# Patient Record
Sex: Female | Born: 1995 | Race: White | Hispanic: No | Smoking: Never smoker
Health system: Southern US, Community
[De-identification: ages and names within clinical notes are randomized; demographics above are authoritative.]

## PROBLEM LIST (undated history)

## (undated) DIAGNOSIS — F419 Anxiety disorder, unspecified: Secondary | ICD-10-CM

## (undated) DIAGNOSIS — Q774 Achondroplasia: Secondary | ICD-10-CM

## (undated) DIAGNOSIS — E079 Disorder of thyroid, unspecified: Secondary | ICD-10-CM

## (undated) DIAGNOSIS — Q789 Osteochondrodysplasia, unspecified: Secondary | ICD-10-CM

## (undated) DIAGNOSIS — F909 Attention-deficit hyperactivity disorder, unspecified type: Secondary | ICD-10-CM

## (undated) DIAGNOSIS — F4310 Post-traumatic stress disorder, unspecified: Secondary | ICD-10-CM

## (undated) DIAGNOSIS — Q039 Congenital hydrocephalus, unspecified: Secondary | ICD-10-CM

## (undated) DIAGNOSIS — F32A Depression, unspecified: Secondary | ICD-10-CM

## (undated) DIAGNOSIS — F329 Major depressive disorder, single episode, unspecified: Secondary | ICD-10-CM

## (undated) HISTORY — DX: Achondroplasia: Q77.4

## (undated) HISTORY — DX: Attention-deficit hyperactivity disorder, unspecified type: F90.9

## (undated) HISTORY — DX: Congenital hydrocephalus, unspecified: Q03.9

## (undated) HISTORY — DX: Osteochondrodysplasia, unspecified: Q78.9

---
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
## 2019-03-08 ENCOUNTER — Other Ambulatory Visit: Payer: Self-pay

## 2019-03-08 DIAGNOSIS — Z20822 Contact with and (suspected) exposure to covid-19: Secondary | ICD-10-CM

## 2019-03-09 LAB — NOVEL CORONAVIRUS, NAA: SARS-CoV-2, NAA: NOT DETECTED

## 2019-03-15 ENCOUNTER — Telehealth: Payer: Self-pay

## 2019-03-15 NOTE — Telephone Encounter (Signed)
Pt. Called back, given COVID 19 results. 

## 2019-04-23 ENCOUNTER — Emergency Department (HOSPITAL_COMMUNITY)
Admission: EM | Admit: 2019-04-23 | Discharge: 2019-04-23 | Disposition: A | Discharge status: Home / Self Care | Payer: Medicaid Other | Attending: Emergency Medicine | Admitting: Emergency Medicine

## 2019-04-23 ENCOUNTER — Encounter (HOSPITAL_COMMUNITY): Payer: Self-pay | Admitting: Emergency Medicine

## 2019-04-23 ENCOUNTER — Other Ambulatory Visit: Payer: Self-pay

## 2019-04-23 DIAGNOSIS — N3001 Acute cystitis with hematuria: Secondary | ICD-10-CM | POA: Diagnosis not present

## 2019-04-23 DIAGNOSIS — F17290 Nicotine dependence, other tobacco product, uncomplicated: Secondary | ICD-10-CM | POA: Diagnosis not present

## 2019-04-23 DIAGNOSIS — R300 Dysuria: Secondary | ICD-10-CM | POA: Diagnosis present

## 2019-04-23 DIAGNOSIS — L249 Irritant contact dermatitis, unspecified cause: Secondary | ICD-10-CM | POA: Diagnosis not present

## 2019-04-23 HISTORY — DX: Disorder of thyroid, unspecified: E07.9

## 2019-04-23 HISTORY — DX: Post-traumatic stress disorder, unspecified: F43.10

## 2019-04-23 HISTORY — DX: Depression, unspecified: F32.A

## 2019-04-23 HISTORY — DX: Anxiety disorder, unspecified: F41.9

## 2019-04-23 LAB — URINALYSIS, ROUTINE W REFLEX MICROSCOPIC
Glucose, UA: 250 mg/dL — AB
Ketones, ur: >80 mg/dL — AB
Nitrite: POSITIVE — AB
Protein, ur: >300 mg/dL — AB
Specific Gravity, Urine: 1.02 (ref 1.005–1.030)
pH: 6.5 (ref 5.0–8.0)

## 2019-04-23 LAB — COMPREHENSIVE METABOLIC PANEL
ALT: 21 U/L (ref 0–44)
AST: 29 U/L (ref 15–41)
Albumin: 3.9 g/dL (ref 3.5–5.0)
Alkaline Phosphatase: 86 U/L (ref 38–126)
Anion gap: 8 (ref 5–15)
BUN: 11 mg/dL (ref 6–20)
CO2: 23 mmol/L (ref 22–32)
Calcium: 8.9 mg/dL (ref 8.9–10.3)
Chloride: 107 mmol/L (ref 98–111)
Creatinine, Ser: 0.43 mg/dL — ABNORMAL LOW (ref 0.44–1.00)
GFR calc Af Amer: >60 mL/min (ref >60)
GFR calc non Af Amer: >60 mL/min (ref >60)
Glucose, Bld: 97 mg/dL (ref 70–99)
Potassium: 3.8 mmol/L (ref 3.5–5.1)
Sodium: 138 mmol/L (ref 135–145)
Total Bilirubin: 0.7 mg/dL (ref 0.3–1.2)
Total Protein: 7.2 g/dL (ref 6.5–8.1)

## 2019-04-23 LAB — CBC WITH DIFFERENTIAL/PLATELET
Abs Immature Granulocytes: 0.08 10*3/uL — ABNORMAL HIGH (ref 0.00–0.07)
Basophils Absolute: 0 10*3/uL (ref 0.0–0.1)
Basophils Relative: 0 %
Eosinophils Absolute: 0.2 10*3/uL (ref 0.0–0.5)
Eosinophils Relative: 1 %
HCT: 43.8 % (ref 36.0–46.0)
Hemoglobin: 14.2 g/dL (ref 12.0–15.0)
Immature Granulocytes: 1 %
Lymphocytes Relative: 13 %
Lymphs Abs: 2.1 10*3/uL (ref 0.7–4.0)
MCH: 25.1 pg — ABNORMAL LOW (ref 26.0–34.0)
MCHC: 32.4 g/dL (ref 30.0–36.0)
MCV: 77.4 fL — ABNORMAL LOW (ref 80.0–100.0)
Monocytes Absolute: 1 10*3/uL (ref 0.1–1.0)
Monocytes Relative: 6 %
Neutro Abs: 12.7 10*3/uL — ABNORMAL HIGH (ref 1.7–7.7)
Neutrophils Relative %: 79 %
Platelets: 319 10*3/uL (ref 150–400)
RBC: 5.66 MIL/uL — ABNORMAL HIGH (ref 3.87–5.11)
RDW: 14.1 % (ref 11.5–15.5)
WBC: 16.2 10*3/uL — ABNORMAL HIGH (ref 4.0–10.5)
nRBC: 0 % (ref 0.0–0.2)

## 2019-04-23 LAB — PREGNANCY, URINE: Preg Test, Ur: NEGATIVE

## 2019-04-23 LAB — URINALYSIS, MICROSCOPIC (REFLEX): Squamous Epithelial / HPF: NONE SEEN (ref 0–5)

## 2019-04-23 LAB — LIPASE, BLOOD: Lipase: 32 U/L (ref 11–51)

## 2019-04-23 LAB — CBG MONITORING, ED: Glucose-Capillary: 82 mg/dL (ref 70–99)

## 2019-04-23 MED ORDER — KETOCONAZOLE 2 % EX CREA: 1.0000 "application " | TOPICAL_CREAM | Freq: Every day | CUTANEOUS | 0 refills | Status: DC

## 2019-04-23 MED ORDER — HYDROCORTISONE BUTYRATE 0.1 % EX CREA: 1.0000 "application " | TOPICAL_CREAM | Freq: Two times a day (BID) | CUTANEOUS | 0 refills | Status: DC

## 2019-04-23 MED ORDER — SODIUM CHLORIDE 0.9 % IV SOLN
1.0000 g | Freq: Once | INTRAVENOUS | Status: AC
  Administered 2019-04-23: 1 g via INTRAVENOUS
  Filled 2019-04-23: qty 10

## 2019-04-23 MED ORDER — CEPHALEXIN 500 MG PO CAPS: 500.0000 mg | ORAL_CAPSULE | Freq: Two times a day (BID) | ORAL | 0 refills | Status: AC

## 2019-04-23 NOTE — ED Triage Notes (Signed)
Patient c/o dysuria with frequency and hematuria that started last night. Denies any fevers, nausea, or vomiting. Patient also c/o rash that appeared yesterday under right eye with itching. Denies any pain.

## 2019-04-23 NOTE — ED Provider Notes (Addendum)
[facility] EMERGENCY DEPARTMENT Provider Note   CSN: [id number] Arrival date & time: 04/23/19  1146     History   Chief Complaint Chief Complaint  Patient presents with  . Dysuria    HPI Katelyn Reed is a 23 y.o. female presents today with 2 concerns.  Patient reports her primary concern is a pruritic rash on the right side of her nose as well as below her left knee.  These occurred yesterday and have been constant since onset a mild itching sensation without aggravating or alleviating factors.  She denies similar in the past.  She reports some scaling of the skin in these areas.  She denies previous diagnosis of tenia.  Patient secondary concern today is dysuria and hematuria that began after she woke up this morning.  She reports a mild burning sensation when she urinates that improves when she stops urinating, no radiation of the symptoms, she reports blood in her urine starting today, she denies similar in the past.  Patient denies fever/chills, headache/vision changes, cough/shortness of breath, chest pain, nausea/vomiting, abdominal pain, flank pain, fall/injury, vaginal bleeding/discharge, concern for STD or any additional concerns today.     HPI  Past Medical History:  Diagnosis Date  . Anxiety   . Depression   . PTSD (post-traumatic stress disorder)   . Thyroid disease     There are no active problems to display for this patient.   History reviewed. No pertinent surgical history.   OB History    Gravida  0   Para  0   Term  0   Preterm  0   AB  0   Living  0     SAB  0   TAB  0   Ectopic  0   Multiple  0   Live Births  0            Home Medications    Prior to Admission medications   Medication Sig Start Date End Date Taking? Authorizing Provider  cephALEXin (KEFLEX) 500 MG capsule Take 1 capsule (500 mg total) by mouth 2 (two) times daily for 7 days. 04/23/19 04/30/19  Harlene SaltsMorelli, Jacey Pelc A, PA-C  hydrocortisone butyrate (LUCOID) 0.1 %  CREA cream Apply 1 application topically 2 (two) times daily. Do not use on the face for more than 1 week as it will cause skin thinning. 04/23/19   Bill SalinasMorelli, Derya Dettmann A, PA-C    Family History Family History  Adopted: Yes    Social History Social History   Tobacco Use  . Smoking status: Current Every Day Smoker    Types: E-cigarettes  . Smokeless tobacco: Never Used  Substance Use Topics  . Alcohol use: Never    Frequency: Never  . Drug use: Never     Allergies   Patient has no known allergies.   Review of Systems Review of Systems Ten systems are reviewed and are negative for acute change except as noted in the HPI   Physical Exam Updated Vital Signs BP 138/71   Pulse 94   Temp 98.5 F (36.9 C) (Oral)   Resp 16   Ht 4\' 7"  (1.397 m)   Wt 57.2 kg   LMP 04/21/2019   SpO2 100%   BMI 29.29 kg/m   Physical Exam Constitutional:      General: She is not in acute distress.    Appearance: Normal appearance. She is well-developed. She is not ill-appearing or diaphoretic.  HENT:     Head: Normocephalic and  atraumatic.     Right Ear: External ear normal.     Left Ear: External ear normal.     Nose: Nose normal.     Mouth/Throat:     Mouth: Mucous membranes are moist.     Pharynx: Oropharynx is clear.  Eyes:     General: Vision grossly intact. Gaze aligned appropriately.     Pupils: Pupils are equal, round, and reactive to light.  Neck:     Musculoskeletal: Normal range of motion.     Trachea: Trachea and phonation normal. No tracheal deviation.  Cardiovascular:     Rate and Rhythm: Normal rate and regular rhythm.     Pulses: Normal pulses.  Pulmonary:     Effort: Pulmonary effort is normal. No respiratory distress.     Breath sounds: Normal breath sounds.  Abdominal:     General: There is no distension.     Palpations: Abdomen is soft.     Tenderness: There is no abdominal tenderness. There is no guarding or rebound.  Genitourinary:    Comments: Pelvic  examination deferred by patient. Musculoskeletal: Normal range of motion.  Skin:    General: Skin is warm and dry.          Comments: Subcentimeter area of scaling rash to left knee and right bridge of nose consistent with tenia.  Neurological:     Mental Status: She is alert.     GCS: GCS eye subscore is 4. GCS verbal subscore is 5. GCS motor subscore is 6.     Comments: Speech is clear and goal oriented, follows commands Major Cranial nerves without deficit, no facial droop Moves extremities without ataxia, coordination intact  Psychiatric:        Behavior: Behavior normal.      ED Treatments / Results  Labs (all labs ordered are listed, but only abnormal results are displayed) Labs Reviewed  URINALYSIS, ROUTINE W REFLEX MICROSCOPIC - Abnormal; Notable for the following components:      Result Value   Color, Urine RED (*)    APPearance TURBID (*)    Glucose, UA 250 (*)    Hgb urine dipstick LARGE (*)    Bilirubin Urine LARGE (*)    Ketones, ur >80 (*)    Protein, ur >300 (*)    Nitrite POSITIVE (*)    Leukocytes,Ua LARGE (*)    All other components within normal limits  URINALYSIS, MICROSCOPIC (REFLEX) - Abnormal; Notable for the following components:   Bacteria, UA RARE (*)    All other components within normal limits  CBC WITH DIFFERENTIAL/PLATELET - Abnormal; Notable for the following components:   WBC 16.2 (*)    RBC 5.66 (*)    MCV 77.4 (*)    MCH 25.1 (*)    Neutro Abs 12.7 (*)    Abs Immature Granulocytes 0.08 (*)    All other components within normal limits  COMPREHENSIVE METABOLIC PANEL - Abnormal; Notable for the following components:   Creatinine, Ser 0.43 (*)    All other components within normal limits  URINE CULTURE  PREGNANCY, URINE  LIPASE, BLOOD  CBG MONITORING, ED    EKG None  Radiology No results found.  Procedures Procedures (including critical care time)  Medications Ordered in ED Medications  cefTRIAXone (ROCEPHIN) 1 g in  sodium chloride 0.9 % 100 mL IVPB (0 g Intravenous Stopped 04/23/19 1530)     Initial Impression / Assessment and Plan / ED Course  I have reviewed the triage vital signs  and the nursing notes.  Pertinent labs & imaging results that were available during my care of the patient were reviewed by me and considered in my medical decision making (see chart for details).    Patient with rash consistent with contact dermatitis likely poison ivy will treat with hydrocortisone cream and encourage PCP follow-up. Patient denies any difficulty breathing or swallowing.  Pt has a patent airway without stridor and is handling secretions without difficulty; no angioedema. No blisters, no pustules, no warmth, no draining sinus tracts, no superficial abscesses, no bullous impetigo, no vesicles, no desquamation, no target lesions with dusky purpura or a central bulla. Not tender to touch. No concern for superimposed infection. No concern for SJS, TEN, TSS, tick borne illness, syphilis or other life-threatening condition. - Additionally patient with dysuria without signs or symptoms of pyelonephritis.  Urinalysis with large hemoglobin, bilirubin, ketones, protein, nitrates and leukocytes, bacteria present.  At this time work-up was expanded to evaluate for other causes of proteinuria and ketonuria - Urine pregnancy test negative CBC with leukocytosis with left shift, likely secondary to UTI CMP nonacute CBG 82 - No evidence of diabetes, nephrotic syndrome or other acute pathologies at this time.  Case was discussed with Dr. Hermina Staggers likely proteinuria, ketonuria and other urinalysis abnormalities secondary to UTI today.  Patient has been given 1 g Rocephin here in the ED should be treated outpatient with Keflex 500 mg twice daily x7 days.  Urine culture sent. - On reevaluation patient resting comfortably no acute distress states understanding care plan and is agreeable to discharge.  No evidence of  pyelonephritis on reevaluation, no flank pain, abdomen soft nontender without peritoneal signs.  At this time there does not appear to be any evidence of an acute emergency medical condition and the patient appears stable for discharge with appropriate outpatient follow up. Diagnosis was discussed with patient who verbalizes understanding of care plan and is agreeable to discharge. I have discussed return precautions with patient and family who verbalizes understanding of return precautions. Patient encouraged to follow-up with their PCP this week. All questions answered.  Patient seen and evaluated by Dr. Deretha Emory during this visit, agrees with discharge and outpatient treatment.  Note: Portions of this report may have been transcribed using voice recognition software. Every effort was made to ensure accuracy; however, inadvertent computerized transcription errors may still be present. Final Clinical Impressions(s) / ED Diagnoses   Final diagnoses:  Acute cystitis with hematuria  Irritant contact dermatitis, unspecified trigger    ED Discharge Orders         Ordered    cephALEXin (KEFLEX) 500 MG capsule  2 times daily     04/23/19 1546    ketoconazole (NIZORAL) 2 % cream  Daily,   Status:  Discontinued     04/23/19 1549    hydrocortisone butyrate (LUCOID) 0.1 % CREA cream  2 times daily     04/23/19 1555           Bill Salinas, PA-C 04/23/19 1556    Elizabeth Palau 04/23/19 1556    Vanetta Mulders, MD 04/23/19 1621

## 2019-04-23 NOTE — ED Notes (Signed)
Pt reports frequency and urgency since last night wore today

## 2019-04-23 NOTE — ED Notes (Signed)
Pt reports she has no history of diabetes

## 2019-04-23 NOTE — ED Notes (Signed)
Spec to lab

## 2019-04-23 NOTE — Discharge Instructions (Addendum)
You have been diagnosed today with urinary tract infection, rash.  At this time there does not appear to be the presence of an emergent medical condition, however there is always the potential for conditions to change. Please read and follow the below instructions.  Please return to the Emergency Department immediately for any new or worsening symptoms or if your symptoms do not improve within 2 days. Please be sure to follow up with your Primary Care Provider within one week regarding your visit today; please call their office to schedule an appointment even if you are feeling better for a follow-up visit. Please take your antibiotic Keflex as prescribed for treatment of urinary tract infection.  Your urine has been sent for culture today, if it grows bacteria that need treatment for different antibiotic that you be contacted by [facility] with new medications. You may use the hydrocortisone cream as directed to help with your rash.  Get help right away if: You have very bad back pain. You have very bad pain in your lower belly. You have a fever. You are sick to your stomach (nauseous). You are throwing up. You have a fever and your symptoms suddenly get worse. You start to feel mixed up (confused). You have a very bad headache or a stiff neck. You have very bad joint pains or stiffness. You have jerky movements that you cannot control (seizure). Your rash covers all or most of your body. The rash may or may not be painful. You have blisters that: Are on top of the rash. Grow larger. Grow together. Are painful. Are inside your nose or mouth. You have a rash that: Looks like purple pinprick-sized spots all over your body. Has a "bull's eye" or looks like a target. Is red and painful, causes your skin to peel, and is not from being in the sun too long. You have any new/concerning or worsening symptoms  Please read the additional information packets attached to your discharge summary.  Do  not take your medicine if  develop an itchy rash, swelling in your mouth or lips, or difficulty breathing; call 911 and seek immediate emergency medical attention if this occurs.

## 2019-04-23 NOTE — ED Notes (Signed)
CBG 82 

## 2019-04-23 NOTE — ED Notes (Signed)
Call to lab re: urine result   Will be a bit longer   Pt informed

## 2019-04-25 LAB — URINE CULTURE

## 2019-11-06 LAB — TSH: TSH: 0.01 — AB (ref 0.41–5.90)

## 2019-12-08 ENCOUNTER — Ambulatory Visit: Payer: Self-pay | Admitting: "Endocrinology

## 2019-12-29 ENCOUNTER — Ambulatory Visit (INDEPENDENT_AMBULATORY_CARE_PROVIDER_SITE_OTHER): Payer: Medicaid Other | Admitting: "Endocrinology

## 2019-12-29 ENCOUNTER — Other Ambulatory Visit: Payer: Self-pay

## 2019-12-29 ENCOUNTER — Encounter: Payer: Self-pay | Admitting: "Endocrinology

## 2019-12-29 VITALS — BP 102/65 | HR 66 | Ht <= 58 in | Wt 145.2 lb

## 2019-12-29 DIAGNOSIS — E0590 Thyrotoxicosis, unspecified without thyrotoxic crisis or storm: Secondary | ICD-10-CM | POA: Diagnosis not present

## 2019-12-29 NOTE — Progress Notes (Signed)
12/29/2019     Endocrinology Consult Note    Subjective:    Patient ID: Katelyn Reed, female    DOB: [birth year], PCP Patient, No Pcp Per.   Past Medical History:  Diagnosis Date  . Achondroplastic dwarfism   . ADHD   . Anxiety   . Chondrodystrophy   . Congenital hydrocephalus (HCC)   . Depression   . PTSD (post-traumatic stress disorder)   . Thyroid disease     History reviewed. No pertinent surgical history.  Social History   Socioeconomic History  . Marital status: Single    Spouse name: Not on file  . Number of children: Not on file  . Years of education: Not on file  . Highest education level: Not on file  Occupational History  . Not on file  Tobacco Use  . Smoking status: Current Every Day Smoker    Types: E-cigarettes  . Smokeless tobacco: Never Used  Substance and Sexual Activity  . Alcohol use: Never  . Drug use: Never  . Sexual activity: Not on file  Other Topics Concern  . Not on file  Social History Narrative  . Not on file   Social Determinants of Health   Financial Resource Strain:   . Difficulty of Paying Living Expenses:   Food Insecurity:   . Worried About Programme researcher, broadcasting/film/video in the Last Year:   . Barista in the Last Year:   Transportation Needs:   . Freight forwarder (Medical):   Marland Kitchen Lack of Transportation (Non-Medical):   Physical Activity:   . Days of Exercise per Week:   . Minutes of Exercise per Session:   Stress:   . Feeling of Stress :   Social Connections:   . Frequency of Communication with Friends and Family:   . Frequency of Social Gatherings with Friends and Family:   . Attends Religious Services:   . Active Member of Clubs or Organizations:   . Attends Banker Meetings:   Marland Kitchen Marital Status:     Family History  Adopted: Yes    Outpatient Encounter Medications as of 12/29/2019  Medication Sig  . [DISCONTINUED] lamoTRIgine (LAMICTAL) 25 MG tablet Take 25 mg by mouth 2 (two) times  daily.  . [DISCONTINUED] QUEtiapine (SEROQUEL) 50 MG tablet Take 50 mg by mouth at bedtime.  . [DISCONTINUED] hydrocortisone butyrate (LUCOID) 0.1 % CREA cream Apply 1 application topically 2 (two) times daily. Do not use on the face for more than 1 week as it will cause skin thinning.   No facility-administered encounter medications on file as of 12/29/2019.    ALLERGIES: No Known Allergies  VACCINATION STATUS:  There is no immunization history on file for this patient.   HPI  Katelyn Reed is 24 y.o. female who presents today with a medical history as above. she is being seen in consultation for hyperthyroidism .  Her referral came from Dr. Jimmey Ralph, MD.  She is currently trying to locate a PMD in the area.  she has been dealing with symptoms of fluctuating body weight, fatigue, palpitations, tremors for a few weeks now.  Of note, she was treated with what appears to be methimazole 2 to 3 years ago with inadequate response.  -Her recent March 2021 labs are consistent with hyperthyroidism. -She did not undergo any thyroid imaging yet. she denies dysphagia, choking, shortness of breath, no recent voice change.    she denies family history of thyroid dysfunction ,  she is adopted.  She reports regular menstrual cycles.   she is not on any anti-thyroid medications nor on any thyroid hormone supplements. she  is willing to proceed with appropriate work up and therapy for thyrotoxicosis.                           Review of systems  Constitutional: + Fluctuating body weight,  + fatigue, + subjective hyperthermia Eyes: no blurry vision, - xerophthalmia ENT: no sore throat, no nodules palpated in throat, no dysphagia/odynophagia, nor hoarseness Cardiovascular: no Chest Pain, no Shortness of Breath, +  palpitations, no leg swelling Respiratory: no cough, no SOB Gastrointestinal: no Nausea, no Vomiting, no Diarhhea Musculoskeletal: no muscle/joint aches Skin: no rashes Neurological: +   tremors, no numbness, no tingling, no dizziness Psychiatric: no depression, +  anxiety   Objective:    BP 102/65   Pulse 66   Ht 4\' 7"  (1.397 m)   Wt 145 lb 3.2 oz (65.9 kg)   BMI 33.75 kg/m   Wt Readings from Last 3 Encounters:  12/29/19 145 lb 3.2 oz (65.9 kg)  04/23/19 126 lb (57.2 kg)                                                Physical exam  Constitutional: Body mass index is 33.75 kg/m., not in acute distress, + stable state of mind, + short stature Eyes: PERRLA, EOMI, - exophthalmos ENT: moist mucous membranes, +  thyromegaly, no cervical lymphadenopathy Cardiovascular: - precordial activity, -tachycardic,  no Murmur/Rubs/Gallops Respiratory:  adequate breathing efforts, no gross chest deformity, Clear to auscultation bilaterally Gastrointestinal: abdomen soft, Non -tender, No distension, Bowel Sounds present Musculoskeletal:  + Medical history of achondroplasic dwarfism, no gross deformities, strength intact in all four extremities Skin: moist, warm, no rashes Neurological: +  tremor with outstretched hands,  + Deep Tendon Reflexes  on both lower extremities.   CMP     Component Value Date/Time   NA 138 04/23/2019 1336   K 3.8 04/23/2019 1336   CL 107 04/23/2019 1336   CO2 23 04/23/2019 1336   GLUCOSE 97 04/23/2019 1336   BUN 11 04/23/2019 1336   CREATININE 0.43 (L) 04/23/2019 1336   CALCIUM 8.9 04/23/2019 1336   PROT 7.2 04/23/2019 1336   ALBUMIN 3.9 04/23/2019 1336   AST 29 04/23/2019 1336   ALT 21 04/23/2019 1336   ALKPHOS 86 04/23/2019 1336   BILITOT 0.7 04/23/2019 1336   GFRNONAA >60 04/23/2019 1336   GFRAA >60 04/23/2019 1336     CBC    Component Value Date/Time   WBC 16.2 (H) 04/23/2019 1336   RBC 5.66 (H) 04/23/2019 1336   HGB 14.2 04/23/2019 1336   HCT 43.8 04/23/2019 1336   PLT 319 04/23/2019 1336   MCV 77.4 (L) 04/23/2019 1336   MCH 25.1 (L) 04/23/2019 1336   MCHC 32.4 04/23/2019 1336   RDW 14.1 04/23/2019 1336   LYMPHSABS 2.1  04/23/2019 1336   MONOABS 1.0 04/23/2019 1336   EOSABS 0.2 04/23/2019 1336   BASOSABS 0.0 04/23/2019 1336    Recent Results (from the past 2160 hour(s))  TSH     Status: Abnormal   Collection Time: 11/06/19 12:00 AM  Result Value Ref Range   TSH 0.01 (A) 0.41 - 5.90    Comment:  TSH <0.006, T4> 24.9, t3U 49, FTI >12.2     Assessment & Plan:   1. Hyperthyroidism she is being seen at a kind request of Patient, No Pcp Per. her history and most recent labs are reviewed, and she was examined clinically. Subjective and objective findings are consistent with thyrotoxicosis likely from primary hyperthyroidism. The potential risks of untreated thyrotoxicosis and the need for definitive therapy have been discussed in detail with her, and she agrees to proceed with diagnostic workup and treatment plan.   -She will need confirmatory thyroid uptake and scan will be scheduled to be done as soon as possible.   Options of therapy are discussed with her.  If she is confirmed to have primary hyperthyroidism,  given the fact that she has treatment failure with methimazole, which she will be considered for the option of treating it with  RAI ablation of the thyroid.   -Patient is made aware of the high likelihood of post ablative hypothyroidism with subsequent need for lifelong thyroid hormone replacement. sheunderstands this outcome  and she is  willing to proceed.  Although surgery is one other choice of treatment in some cases, in her case surgery is not a good fit for presentation with only mild goiter.    she will return in 7-10 days for treatment decision. Her pulse rate is controlled at 66, will not need any beta-blocker initiation today.    -Patient is advised to maintain close follow up with her care at [facility] until she locates PMD in the area.    - Time spent with the patient: 60 minutes, of which >50% was spent in obtaining information about her symptoms, reviewing her  previous labs, evaluations, and treatments, counseling her about her hyperthyroidism, and developing a plan to confirm the diagnosis and long term treatment as necessary. Please refer to " Patient Self Inventory" in the Media  tab for reviewed elements of pertinent patient history.  Cleophus Molt participated in the discussions, expressed understanding, and voiced agreement with the above plans.  All questions were answered to her satisfaction. she is encouraged to contact clinic should she have any questions or concerns prior to her return visit.   Follow up plan: Return in about 1 week (around 01/05/2020) for F/U with Thyroid Uptake and Scan.   Thank you for involving me in the care of this pleasant patient, and I will continue to update you with her progress.  Marquis Lunch, MD [facility] Phone: [phone]  Fax: [phone]   12/29/2019, 1:27 PM  This note was partially dictated with voice recognition software. Similar sounding words can be transcribed inadequately or may not  be corrected upon review.

## 2020-01-02 ENCOUNTER — Telehealth: Payer: Self-pay | Admitting: "Endocrinology

## 2020-01-02 NOTE — Telephone Encounter (Signed)
Pt has not heard from [facility] to schedule her uptake and scan. She is scheduled to see [place] back here June 1. Please advise.

## 2020-01-02 NOTE — Telephone Encounter (Signed)
Left a message requesting a return call to the office. 

## 2020-01-02 NOTE — Telephone Encounter (Signed)
Discussed with pt that request resent for uptake and scan and she should hear from scheduling within the next few days. Understanding voiced.

## 2020-01-09 ENCOUNTER — Ambulatory Visit: Payer: Medicaid Other | Admitting: "Endocrinology

## 2020-01-11 ENCOUNTER — Encounter (HOSPITAL_COMMUNITY)
Admission: RE | Admit: 2020-01-11 | Discharge: 2020-01-11 | Disposition: A | Discharge status: Home / Self Care | Payer: Medicaid Other | Source: Ambulatory Visit | Attending: "Endocrinology | Admitting: "Endocrinology

## 2020-01-11 ENCOUNTER — Other Ambulatory Visit: Payer: Self-pay

## 2020-01-11 DIAGNOSIS — E0590 Thyrotoxicosis, unspecified without thyrotoxic crisis or storm: Secondary | ICD-10-CM | POA: Diagnosis present

## 2020-01-11 MED ORDER — SODIUM IODIDE I-123 7.4 MBQ CAPS
440.0000 | ORAL_CAPSULE | Freq: Once | ORAL | Status: AC
  Administered 2020-01-11: 440 via ORAL

## 2020-01-12 ENCOUNTER — Encounter (HOSPITAL_COMMUNITY)
Admission: RE | Admit: 2020-01-12 | Discharge: 2020-01-12 | Disposition: A | Discharge status: Home / Self Care | Payer: Medicaid Other | Source: Ambulatory Visit | Attending: "Endocrinology | Admitting: "Endocrinology

## 2020-01-17 ENCOUNTER — Ambulatory Visit (INDEPENDENT_AMBULATORY_CARE_PROVIDER_SITE_OTHER): Payer: Medicaid Other | Admitting: "Endocrinology

## 2020-01-17 ENCOUNTER — Encounter: Payer: Self-pay | Admitting: "Endocrinology

## 2020-01-17 ENCOUNTER — Other Ambulatory Visit: Payer: Self-pay

## 2020-01-17 VITALS — BP 105/68 | HR 69 | Ht <= 58 in | Wt 147.4 lb

## 2020-01-17 DIAGNOSIS — E0590 Thyrotoxicosis, unspecified without thyrotoxic crisis or storm: Secondary | ICD-10-CM

## 2020-01-17 NOTE — Progress Notes (Signed)
01/17/2020     Endocrinology follow-up note    Subjective:    Patient ID: Katelyn Reed, female    DOB: 04-Jun-1996, PCP Patient, No Pcp Per.   Past Medical History:  Diagnosis Date  . Achondroplastic dwarfism   . ADHD   . Anxiety   . Chondrodystrophy   . 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 Depression   . PTSD (post-traumatic stress disorder)   . Thyroid disease     History reviewed. No pertinent surgical history.  Social History   Socioeconomic History  . Marital status: Single    Spouse name: Not on file  . Number of children: Not on file  . Years of education: Not on file  . Highest education level: Not on file  Occupational History  . Not on file  Tobacco Use  . Smoking status: Current Every Day Smoker    Types: E-cigarettes  . Smokeless tobacco: Never Used  Substance and Sexual Activity  . Alcohol use: Never  . Drug use: Never  . Sexual activity: Not on file  Other Topics Concern  . Not on file  Social History Narrative  . Not on file   Social Determinants of Health   Financial Resource Strain:   . Difficulty of Paying Living Expenses:   Food Insecurity:   . Worried About Charity fundraiser in the Last Year:   . Arboriculturist in the Last Year:   Transportation Needs:   . Film/video editor (Medical):   Marland Kitchen Lack of Transportation (Non-Medical):   Physical Activity:   . Days of Exercise per Week:   . Minutes of Exercise per Session:   Stress:   . Feeling of Stress :   Social Connections:   . Frequency of Communication with Friends and Family:   . Frequency of Social Gatherings with Friends and Family:   . Attends Religious Services:   . Active Member of Clubs or Organizations:   . Attends Archivist Meetings:   Marland Kitchen Marital Status:     Family History  Adopted: Yes    No outpatient encounter medications on file as of 01/17/2020.   No facility-administered encounter medications on file as of 01/17/2020.     ALLERGIES: No Known Allergies  VACCINATION STATUS:  There is no immunization history on file for this patient.   HPI  Katelyn Reed is 24 y.o. female who presents today with with recently performed thyroid uptake and scan.  She was seen in consultation for hyperthyroidism.    Her referral came from Dr. Kathlee Nations, MD.  She is currently trying to locate a PMD in the area.  she has been dealing with symptoms of fluctuating body weight, fatigue, palpitations, tremors for a few weeks now.  Of note, she was treated with what appears to be methimazole 2 to 3 years ago with inadequate response.  -Although her March labs were consistent with hyperthyroidism, her subsequent thyroid uptake and scan is not confirmatory.  Her uptake is 19% in 24 hours.   -She denies palpitations, tremors, nor heat/cold intolerance. she denies dysphagia, choking, shortness of breath, no recent voice change.    she denies family history of thyroid dysfunction , she is adopted.  She reports regular menstrual cycles.   she is not on any anti-thyroid medications nor on any thyroid hormone supplements. she  is willing to proceed with appropriate work up and therapy for thyrotoxicosis.  Review of systems  Constitutional: + Steady weight since May,   + fatigue, - subjective hyperthermia Eyes: no blurry vision, - xerophthalmia ENT: no sore throat, no nodules palpated in throat, no dysphagia/odynophagia, nor hoarseness Cardiovascular: no Chest Pain, no Shortness of Breath, -  palpitations, no leg swelling Respiratory: no cough, no SOB Gastrointestinal: no Nausea, no Vomiting, no Diarhhea Musculoskeletal: no muscle/joint aches Skin: no rashes Neurological: -  tremors, no numbness, no tingling, no dizziness Psychiatric: no depression, +  anxiety   Objective:    BP 105/68   Pulse 69   Ht 4\' 7"  (1.397 m)   Wt 147 lb 6.4 oz (66.9 kg)   LMP 01/02/2020 (Exact Date)   BMI 34.26 kg/m    Wt Readings from Last 3 Encounters:  01/17/20 147 lb 6.4 oz (66.9 kg)  12/29/19 145 lb 3.2 oz (65.9 kg)  04/23/19 126 lb (57.2 kg)                                                Physical exam  Physical Exam- Limited  Constitutional:  Body mass index is 34.26 kg/m. , not in acute distress, normal state of mind Eyes:  EOMI, no exophthalmos Neck: Supple Thyroid: No gross goiter Respiratory: Adequate breathing efforts Musculoskeletal: no gross deformities, strength intact in all four extremities, no gross restriction of joint movements Skin:  no rashes, no hyperemia Neurological: no tremor with outstretched hands,    CMP     Component Value Date/Time   NA 138 04/23/2019 1336   K 3.8 04/23/2019 1336   CL 107 04/23/2019 1336   CO2 23 04/23/2019 1336   GLUCOSE 97 04/23/2019 1336   BUN 11 04/23/2019 1336   CREATININE 0.43 (L) 04/23/2019 1336   CALCIUM 8.9 04/23/2019 1336   PROT 7.2 04/23/2019 1336   ALBUMIN 3.9 04/23/2019 1336   AST 29 04/23/2019 1336   ALT 21 04/23/2019 1336   ALKPHOS 86 04/23/2019 1336   BILITOT 0.7 04/23/2019 1336   GFRNONAA >60 04/23/2019 1336   GFRAA >60 04/23/2019 1336     CBC    Component Value Date/Time   WBC 16.2 (H) 04/23/2019 1336   RBC 5.66 (H) 04/23/2019 1336   HGB 14.2 04/23/2019 1336   HCT 43.8 04/23/2019 1336   PLT 319 04/23/2019 1336   MCV 77.4 (L) 04/23/2019 1336   MCH 25.1 (L) 04/23/2019 1336   MCHC 32.4 04/23/2019 1336   RDW 14.1 04/23/2019 1336   LYMPHSABS 2.1 04/23/2019 1336   MONOABS 1.0 04/23/2019 1336   EOSABS 0.2 04/23/2019 1336   BASOSABS 0.0 04/23/2019 1336    Recent Results (from the past 2160 hour(s))  TSH     Status: Abnormal   Collection Time: 11/06/19 12:00 AM  Result Value Ref Range   TSH 0.01 (A) 0.41 - 5.90    Comment: TSH <0.006, T4> 24.9, t3U 49, FTI >12.2    On January 12, 2020 thyroid uptake and scan was performed on FINDINGS: Homogeneous tracer distribution in both thyroid lobes.  No focal areas  of increased or decreased tracer localization seen  4 hour I-123 uptake = 12% (normal 5-20%), 24 hour I-123 uptake = 19% (normal 10-30%)  IMPRESSION: Normal exam.    Assessment & Plan:   1. Hyperthyroidism -Her uptake and scan was discussed with her and is not confirming primary hypothyroidism.  Her 24-hour uptake was normal at 19%.  There is a possibility of resolution of transient thyrotoxicosis that she had in March 2021.   Differentials at this time include subacute thyroiditis with transient thyrotoxicosis, appreciate thyrotoxicosis, or treatment response. -I had a discussion with her about not intervention at this time with plan to repeat thyroid function test in 9 weeks and office visit in 10 weeks.  Options of therapy are discussed with her.  If she is confirmed to have primary hyperthyroidism,  given the fact that she has treatment failure with methimazole, which she will be considered for the option of treating it with  RAI ablation of the thyroid.   -Patient is made aware of the high likelihood of post ablative hypothyroidism with subsequent need for lifelong thyroid hormone replacement. sheunderstands this outcome  and she is  willing to proceed.  Although surgery is one other choice of treatment in some cases, in her case surgery is not a good fit for presentation with only mild goiter.    Her pulse rate is controlled at 66-69, will not need any beta-blocker initiation today.    -Patient is advised to maintain close follow up with her care at [facility] until she locates PMD in the area.       - Time spent on this patient care encounter:  20 minutes of which 50% was spent in  counseling and the rest reviewing  her current and  previous labs / studies and medications  doses and developing a plan for long term care. Cleophus Molt  participated in the discussions, expressed understanding, and voiced agreement with the above plans.  All questions were answered  to her satisfaction. she is encouraged to contact clinic should she have any questions or concerns prior to her return visit.   Follow up plan: Return in about 10 weeks (around 03/27/2020) for F/U with Pre-visit Labs.   Thank you for involving me in the care of this pleasant patient, and I will continue to update you with her progress.  Marquis Lunch, MD [facility] Phone: [phone]  Fax: [phone]   01/17/2020, 9:27 AM  This note was partially dictated with voice recognition software. Similar sounding words can be transcribed inadequately or may not  be corrected upon review.

## 2020-02-15 ENCOUNTER — Encounter (HOSPITAL_COMMUNITY): Payer: Self-pay | Admitting: Emergency Medicine

## 2020-02-15 ENCOUNTER — Other Ambulatory Visit: Payer: Self-pay

## 2020-02-15 ENCOUNTER — Emergency Department (HOSPITAL_COMMUNITY)
Admission: EM | Admit: 2020-02-15 | Discharge: 2020-02-15 | Disposition: A | Discharge status: Home / Self Care | Payer: Medicaid Other | Attending: Emergency Medicine | Admitting: Emergency Medicine

## 2020-02-15 DIAGNOSIS — Z79899 Other long term (current) drug therapy: Secondary | ICD-10-CM | POA: Diagnosis not present

## 2020-02-15 DIAGNOSIS — L237 Allergic contact dermatitis due to plants, except food: Secondary | ICD-10-CM | POA: Diagnosis not present

## 2020-02-15 DIAGNOSIS — F17210 Nicotine dependence, cigarettes, uncomplicated: Secondary | ICD-10-CM | POA: Insufficient documentation

## 2020-02-15 DIAGNOSIS — R21 Rash and other nonspecific skin eruption: Secondary | ICD-10-CM | POA: Diagnosis present

## 2020-02-15 LAB — POC URINE PREG, ED: Preg Test, Ur: NEGATIVE

## 2020-02-15 MED ORDER — PREDNISONE 10 MG PO TABS: ORAL_TABLET | ORAL | 0 refills | Status: DC

## 2020-02-15 MED ORDER — DEXAMETHASONE SODIUM PHOSPHATE 10 MG/ML IJ SOLN
10.0000 mg | Freq: Once | INTRAMUSCULAR | Status: AC
  Administered 2020-02-15: 10 mg via INTRAMUSCULAR
  Filled 2020-02-15: qty 1

## 2020-02-15 NOTE — ED Triage Notes (Signed)
Patient has poison ivy on her right arm and it is spreading to her neck and face.

## 2020-02-15 NOTE — Discharge Instructions (Addendum)
Take your first dose of the prednisone tomorrow evening as you have received a good dose of the steroid medication tonight with the injection.  You may continue using Benadryl to help with itch.  A nondrowsy alternative to Benadryl would be Claritin or Zyrtec.  Cool compresses and ice packs can also help with particularly itchy sites.  Try to avoid scratching to prevent secondary infection at these rash sites.  You may use calamine lotion in places that are oozing as discussed.  Goldbond antiitch cream (or the generic for this) is also an excellent cream for minimizing itching as this runs its course.

## 2020-02-17 NOTE — ED Provider Notes (Signed)
[facility] EMERGENCY DEPARTMENT Provider Note   CSN: [id number] Arrival date & time: 02/15/20  1919     History Chief Complaint  Patient presents with  . Rash    Katelyn Reed is a 24 y.o. female.  The history is provided by the patient.  Rash Location:  Face and shoulder/arm Facial rash location:  R cheek and forehead Shoulder/arm rash location:  R forearm and L forearm Quality: blistering, itchiness and weeping   Quality: not painful   Severity:  Moderate Onset quality:  Gradual Duration:  1 day Timing:  Constant Context: plant contact   Context comment:  She did some yard work 2 days ago, suspects she came in contact with poison ivy Relieved by:  Nothing Worsened by:  Nothing Ineffective treatments:  Anti-itch cream and antihistamines Associated symptoms: no fever, no joint pain, no shortness of breath, no throat swelling, no tongue swelling and not wheezing        Past Medical History:  Diagnosis Date  . Achondroplastic dwarfism   . ADHD   . Anxiety   . Chondrodystrophy   . Congenital hydrocephalus ([facility])   . Depression   . PTSD (post-traumatic stress disorder)   . Thyroid disease     Patient Active Problem List   Diagnosis Date Noted  . Hyperthyroidism 12/29/2019    History reviewed. No pertinent surgical history.   OB History    Gravida  0   Para  0   Term  0   Preterm  0   AB  0   Living  0     SAB  0   TAB  0   Ectopic  0   Multiple  0   Live Births  0           Family History  Adopted: Yes    Social History   Tobacco Use  . Smoking status: Current Every Day Smoker    Types: E-cigarettes  . Smokeless tobacco: Never Used  Vaping Use  . Vaping Use: Some days  . Substances: Flavoring  Substance Use Topics  . Alcohol use: Never  . Drug use: Never    Home Medications Prior to Admission medications   Medication Sig Start Date End Date Taking? Authorizing Provider  predniSONE (DELTASONE) 10 MG tablet Take 6  tablets day one, 5 tablets day two, 4 tablets day three, 3 tablets day four, 2 tablets day five, then 1 tablet day six 02/15/20   Amie Cowens, Raynelle Fanning, PA-C    Allergies    Azithromycin and Hydrocodone  Review of Systems   Review of Systems  Constitutional: Negative for fever.  Respiratory: Negative for shortness of breath and wheezing.   Musculoskeletal: Negative for arthralgias.  Skin: Positive for rash.  All other systems reviewed and are negative.   Physical Exam Updated Vital Signs BP 125/82 (BP Location: Right Arm)   Pulse 62   Temp 99 F (37.2 C) (Oral)   Resp 16   Ht 4\' 7"  (1.397 m)   Wt 65.8 kg   LMP 01/30/2020 (Approximate)   SpO2 98%   BMI 33.70 kg/m   Physical Exam Constitutional:      General: She is not in acute distress.    Appearance: She is well-developed.  HENT:     Head: Normocephalic.  Cardiovascular:     Rate and Rhythm: Normal rate.  Pulmonary:     Effort: Pulmonary effort is normal.     Breath sounds: No wheezing.  Musculoskeletal:  General: Normal range of motion.     Cervical back: Neck supple.  Skin:    General: Skin is warm.     Findings: Rash present. Rash is papular and vesicular.     Comments: Linear, papular rash forearms and right face, few vesicles interspersed, exoriations  With several spots with clear weeping.       ED Results / Procedures / Treatments   Labs (all labs ordered are listed, but only abnormal results are displayed) Labs Reviewed  POC URINE PREG, ED    EKG None  Radiology No results found.  Procedures Procedures (including critical care time)  Medications Ordered in ED Medications  dexamethasone (DECADRON) injection 10 mg (10 mg Intramuscular Given 02/15/20 2210)    ED Course  I have reviewed the triage vital signs and the nursing notes.  Pertinent labs & imaging results that were available during my care of the patient were reviewed by me and considered in my medical decision making (see chart for  details).    MDM Rules/Calculators/A&P                           Exam and hx c/w poison ivy dermatitis.  She was started on prednisone taper, encouraged to continue benadryl or claritin if prefers non sedating antihistamine. Cool compresses, avoid scratching.  Gold Bond anti itch cream, calamine on wet locations.  Also discussed cleaning yard tools/clothing, shoes.  No evidence of superficial infection/cellulitis.   .Final Clinical Impression(s) / ED Diagnoses Final diagnoses:  Allergic dermatitis due to poison ivy    Rx / DC Orders ED Discharge Orders         Ordered    predniSONE (DELTASONE) 10 MG tablet     Discontinue  Reprint     02/15/20 2206           Burgess Amor, PA-C 02/17/20 1205    Terald Sleeper, MD 02/18/20 1907

## 2020-03-27 ENCOUNTER — Ambulatory Visit: Payer: Medicaid Other | Admitting: Nurse Practitioner

## 2020-05-14 ENCOUNTER — Emergency Department (HOSPITAL_COMMUNITY)
Admission: EM | Admit: 2020-05-14 | Discharge: 2020-05-14 | Disposition: A | Discharge status: Home / Self Care | Payer: Medicaid Other | Attending: Emergency Medicine | Admitting: Emergency Medicine

## 2020-05-14 ENCOUNTER — Other Ambulatory Visit: Payer: Self-pay

## 2020-05-14 ENCOUNTER — Encounter (HOSPITAL_COMMUNITY): Payer: Self-pay | Admitting: Emergency Medicine

## 2020-05-14 DIAGNOSIS — R519 Headache, unspecified: Secondary | ICD-10-CM | POA: Insufficient documentation

## 2020-05-14 DIAGNOSIS — J069 Acute upper respiratory infection, unspecified: Secondary | ICD-10-CM

## 2020-05-14 DIAGNOSIS — Z20822 Contact with and (suspected) exposure to covid-19: Secondary | ICD-10-CM | POA: Diagnosis not present

## 2020-05-14 DIAGNOSIS — R0981 Nasal congestion: Secondary | ICD-10-CM | POA: Diagnosis present

## 2020-05-14 DIAGNOSIS — F17290 Nicotine dependence, other tobacco product, uncomplicated: Secondary | ICD-10-CM | POA: Diagnosis not present

## 2020-05-14 LAB — RESPIRATORY PANEL BY RT PCR (FLU A&B, COVID)
Influenza A by PCR: NEGATIVE
Influenza B by PCR: NEGATIVE
SARS Coronavirus 2 by RT PCR: NEGATIVE

## 2020-05-14 LAB — GROUP A STREP BY PCR: Group A Strep by PCR: NOT DETECTED

## 2020-05-14 NOTE — ED Triage Notes (Signed)
Pt c/o nasal congestion that started last night. Denies fevers.

## 2020-05-14 NOTE — Discharge Instructions (Signed)
Your evaluated in the emergency department for your cold symptoms.  You have been tested for strep throat, which was negative for infection. You have also been tested for COVID-19, influenza a/B.  You were negative also for all of these.   It is likely that you have a another viral upper respiratory infection.  You may treat with over-the-counter pain medication such as Tylenol/ibuprofen for headache, sore throat.  Rest and hydration will be important for healing.  Please return to the emergency department should she develop any new chest pain, shortness of breath, heart palpitations, intractable nausea/vomiting, or any other new severe symptoms.

## 2020-05-14 NOTE — ED Provider Notes (Signed)
[facility] EMERGENCY DEPARTMENT Provider Note   CSN: [id number] Arrival date & time: 05/14/20  1700    History Chief Complaint  Patient presents with   Nasal Congestion    Katelyn Reed is a 24 y.o. female presents with 1 day of headache, nasal congestion, sore throat, night sweats.  States she was feeling well yesterday.  She is not vaccinated against COVID-19.  She denies cough, shortness of breath, nausea, vomiting, abdominal pain, diarrhea, fever.  Patient denies known sick contacts, states she works at [employer] and they are supposed to wear masks, however they usually do not.    Personally reviewed this patient's medical record.  She has history of achondroplastic dwarfism, anxiety, PTSD, hyperthyroidism.   HPI     Past Medical History:  Diagnosis Date   Achondroplastic dwarfism    ADHD    Anxiety    Chondrodystrophy    Congenital hydrocephalus ([facility])    Depression    PTSD (post-traumatic stress disorder)    Thyroid disease     Patient Active Problem List   Diagnosis Date Noted   Hyperthyroidism 12/29/2019    History reviewed. No pertinent surgical history.   OB History    Gravida  0   Para  0   Term  0   Preterm  0   AB  0   Living  0     SAB  0   TAB  0   Ectopic  0   Multiple  0   Live Births  0           Family History  Adopted: Yes    Social History   Tobacco Use   Smoking status: Current Every Day Smoker    Types: E-cigarettes   Smokeless tobacco: Never Used  Vaping Use   Vaping Use: Some days   Substances: Flavoring  Substance Use Topics   Alcohol use: Yes    Alcohol/week: 4.0 standard drinks    Types: 4 Cans of beer per week   Drug use: Never    Home Medications Prior to Admission medications   Medication Sig Start Date End Date Taking? Authorizing Provider  predniSONE (DELTASONE) 10 MG tablet Take 6 tablets day one, 5 tablets day two, 4 tablets day three, 3 tablets day four, 2 tablets day five,  then 1 tablet day six 02/15/20   Idol, Raynelle Fanning, PA-C    Allergies    Azithromycin and Hydrocodone  Review of Systems   Review of Systems  Constitutional: Positive for diaphoresis. Negative for chills and fever.  HENT: Positive for congestion, rhinorrhea, sinus pressure and sore throat. Negative for ear pain, hearing loss, mouth sores, sinus pain, trouble swallowing and voice change.   Eyes: Negative for photophobia, pain, redness and visual disturbance.  Respiratory: Negative for cough, chest tightness and shortness of breath.   Cardiovascular: Negative for chest pain, palpitations and leg swelling.  Gastrointestinal: Negative for abdominal pain, diarrhea, nausea and vomiting.  Genitourinary: Negative for dysuria.  Musculoskeletal: Negative.   Skin: Negative.   Neurological: Positive for headaches. Negative for dizziness, weakness and light-headedness.  Hematological: Negative.     Physical Exam Updated Vital Signs BP 117/74 (BP Location: Right Arm)    Pulse 74    Temp 98.6 F (37 C) (Oral)    Resp 18    Ht 4\' 8"  (1.422 m)    Wt 59 kg    LMP 05/14/2020    SpO2 100%    BMI 29.15 kg/m   Physical  Exam Vitals and nursing note reviewed.  HENT:     Head: Normocephalic and atraumatic.     Nose: Rhinorrhea present.     Mouth/Throat:     Mouth: Mucous membranes are moist.     Pharynx: Uvula midline. Oropharyngeal exudate and posterior oropharyngeal erythema present.     Comments: Posterior pharyngeal cobblestoning, scant white/yellow exudate. Eyes:     General:        Right eye: No discharge.        Left eye: No discharge.     Conjunctiva/sclera: Conjunctivae normal.     Pupils: Pupils are equal, round, and reactive to light.  Cardiovascular:     Rate and Rhythm: Normal rate and regular rhythm.     Pulses: Normal pulses.     Heart sounds: Normal heart sounds.  Pulmonary:     Effort: Pulmonary effort is normal. No respiratory distress.     Breath sounds: Normal breath sounds. No  decreased air movement. No wheezing or rales.  Abdominal:     General: There is no distension.     Palpations: Abdomen is soft.     Tenderness: There is no abdominal tenderness.  Musculoskeletal:        General: No deformity. Normal range of motion.     Cervical back: Neck supple. No tenderness.     Right lower leg: No edema.     Left lower leg: No edema.  Lymphadenopathy:     Cervical: No cervical adenopathy.  Skin:    General: Skin is warm and dry.     Capillary Refill: Capillary refill takes less than 2 seconds.  Neurological:     Mental Status: She is alert and oriented to person, place, and time.  Psychiatric:        Mood and Affect: Mood normal.     ED Results / Procedures / Treatments   Labs (all labs ordered are listed, but only abnormal results are displayed) Labs Reviewed  GROUP A STREP BY PCR  RESPIRATORY PANEL BY RT PCR (FLU A&B, COVID)    EKG None  Radiology No results found.  Procedures Procedures (including critical care time)  Medications Ordered in ED Medications - No data to display  ED Course  I have reviewed the triage vital signs and the nursing notes.  Pertinent labs & imaging results that were available during my care of the patient were reviewed by me and considered in my medical decision making (see chart for details).    MDM Rules/Calculators/A&P                         Patient with URI symptoms.  Not vaccinated against COVID-19. With exudate on physical exam will test for strep pharyngitis. We will test for COVID-19.  Respiratory panel negative for COVID-19, influenza A/B  PCR negative for group A strep pharyngitis.  This patient's vital signs are normal, cardiopulmonary exam is reassuring.  It is likely this patient has another viral upper respiratory infection.  She may treat her symptoms at home with over-the-counter decongestants, and analgesia such as Tylenol/ibuprofen.  Will provide work note.  At this time I do not feel any  further work-up is necessary in the emergency department at this time.  Patient voiced understanding of her medical evaluation and treatment plan.  Each of her questions were answered to her expressed satisfaction.  Patient stable for discharge at this time.  Katelyn Reed was evaluated in Emergency Department on 05/14/2020 for  the symptoms described in the history of present illness. She was evaluated in the context of the global COVID-19 pandemic, which necessitated consideration that the patient might be at risk for infection with the SARS-CoV-2 virus that causes COVID-19. Institutional protocols and algorithms that pertain to the evaluation of patients at risk for COVID-19 are in a state of rapid change based on information released by regulatory bodies including the CDC and federal and state organizations. These policies and algorithms were followed during the patient's care in the ED.  Final Clinical Impression(s) / ED Diagnoses Final diagnoses:  None    Rx / DC Orders ED Discharge Orders    None       Sherrilee Gilles 05/14/20 Loistine Chance, MD 05/17/20 [phone]

## 2020-08-27 ENCOUNTER — Telehealth: Payer: Self-pay

## 2020-08-27 NOTE — Telephone Encounter (Signed)
Received a call from the law firm about records. I tried to reach them back and was placed on hold for a brief time so I had to hang up. We did received record request multiple times and we forwarded that to [employer]

## 2020-09-10 NOTE — Telephone Encounter (Signed)
Received request for medical records, forward to [facility] again.

## 2021-06-05 ENCOUNTER — Encounter: Payer: Self-pay | Admitting: Emergency Medicine

## 2021-06-05 ENCOUNTER — Ambulatory Visit
Admission: EM | Admit: 2021-06-05 | Discharge: 2021-06-05 | Disposition: A | Discharge status: Home / Self Care | Payer: BC Managed Care – PPO | Attending: Physician Assistant | Admitting: Physician Assistant

## 2021-06-05 ENCOUNTER — Other Ambulatory Visit: Payer: Self-pay

## 2021-06-05 DIAGNOSIS — H6690 Otitis media, unspecified, unspecified ear: Secondary | ICD-10-CM | POA: Diagnosis not present

## 2021-06-05 MED ORDER — AMOXICILLIN 500 MG PO CAPS: 500.0000 mg | ORAL_CAPSULE | Freq: Three times a day (TID) | ORAL | 0 refills | Status: DC

## 2021-06-05 NOTE — ED Triage Notes (Signed)
Triage by provider  

## 2021-06-08 NOTE — ED Provider Notes (Signed)
[facility]    CSN: [id number] Arrival date & time: 06/05/21  1054      History   Chief Complaint Chief Complaint  Patient presents with   Otalgia    HPI Katelyn Reed is a 25 y.o. female.   The history is provided by the patient. No language interpreter was used.  Otalgia Location:  Bilateral Quality:  Aching Severity:  Moderate Onset quality:  Gradual Progression:  Worsening Chronicity:  New Relieved by:  Nothing Worsened by:  Nothing Ineffective treatments:  None tried Associated symptoms: no sore throat    Past Medical History:  Diagnosis Date   Achondroplastic dwarfism    ADHD    Anxiety    Chondrodystrophy    Congenital hydrocephalus (HCC)    Depression    PTSD (post-traumatic stress disorder)    Thyroid disease     Patient Active Problem List   Diagnosis Date Noted   Hyperthyroidism 12/29/2019    No past surgical history on file.  OB History   No obstetric history on file.      Home Medications    Prior to Admission medications   Medication Sig Start Date End Date Taking? Authorizing Provider  amoxicillin (AMOXIL) 500 MG capsule Take 1 capsule (500 mg total) by mouth 3 (three) times daily. 06/05/21  Yes Cheron Schaumann K, PA-C  predniSONE (DELTASONE) 10 MG tablet Take 6 tablets day one, 5 tablets day two, 4 tablets day three, 3 tablets day four, 2 tablets day five, then 1 tablet day six 02/15/20   Burgess Amor, PA-C    Family History Family History  Adopted: Yes    Social History Social History   Tobacco Use   Smokeless tobacco: Never  Vaping Use   Vaping Use: Some days   Substances: Flavoring  Substance Use Topics   Alcohol use: Yes    Alcohol/week: 4.0 standard drinks    Types: 4 Cans of beer per week   Drug use: Never     Allergies   Azithromycin and Hydrocodone   Review of Systems Review of Systems  HENT:  Positive for ear pain. Negative for sore throat.   All other systems reviewed and are  negative.   Physical Exam Triage Vital Signs ED Triage Vitals  Enc Vitals Group     BP 06/05/21 1336 125/81     Pulse Rate 06/05/21 1336 69     Resp 06/05/21 1336 15     Temp 06/05/21 1336 98.4 F (36.9 C)     Temp Source 06/05/21 1336 Oral     SpO2 06/05/21 1336 98 %     Weight --      Height --      Head Circumference --      Peak Flow --      Pain Score 06/05/21 1337 9     Pain Loc --      Pain Edu? --      Excl. in GC? --    No data found.  Updated Vital Signs BP 125/81 (BP Location: Right Arm)   Pulse 69   Temp 98.4 F (36.9 C) (Oral)   Resp 15   LMP 06/05/2021 (Exact Date)   SpO2 98%   Visual Acuity Right Eye Distance:   Left Eye Distance:   Bilateral Distance:    Right Eye Near:   Left Eye Near:    Bilateral Near:     Physical Exam Vitals and nursing note reviewed.  Constitutional:  Appearance: She is well-developed.  HENT:     Head: Normocephalic.     Right Ear: External ear normal.     Left Ear: External ear normal.  Pulmonary:     Effort: Pulmonary effort is normal.  Abdominal:     General: There is no distension.  Musculoskeletal:        General: Normal range of motion.     Cervical back: Normal range of motion.  Neurological:     Mental Status: She is alert and oriented to person, place, and time.     UC Treatments / Results  Labs (all labs ordered are listed, but only abnormal results are displayed) Labs Reviewed - No data to display  EKG   Radiology No results found.  Procedures Procedures (including critical care time)  Medications Ordered in UC Medications - No data to display  Initial Impression / Assessment and Plan / UC Course  I have reviewed the triage vital signs and the nursing notes.  Pertinent labs & imaging results that were available during my care of the patient were reviewed by me and considered in my medical decision making (see chart for details).     MDM:  Pt given rx for amoxicillian   Final  Clinical Impressions(s) / UC Diagnoses   Final diagnoses:  Acute otitis media, unspecified otitis media type   Discharge Instructions   None    ED Prescriptions     Medication Sig Dispense Auth. Provider   amoxicillin (AMOXIL) 500 MG capsule Take 1 capsule (500 mg total) by mouth 3 (three) times daily. 30 capsule Elson Areas, [place]      PDMP not reviewed this encounter. An After Visit Summary was printed and given to the patient.    Elson Areas, [place] 06/08/21 2841

## 2021-12-18 ENCOUNTER — Other Ambulatory Visit: Payer: Self-pay

## 2021-12-18 ENCOUNTER — Encounter (HOSPITAL_COMMUNITY): Payer: Self-pay

## 2021-12-18 ENCOUNTER — Emergency Department (HOSPITAL_COMMUNITY): Payer: Medicaid Other

## 2021-12-18 DIAGNOSIS — M79672 Pain in left foot: Secondary | ICD-10-CM | POA: Diagnosis present

## 2021-12-18 NOTE — ED Triage Notes (Signed)
Pov from home. Cc of right ankle pain around 2 weeks ago. Thought she just sprained but but now says the bottom of her heel feels like glass when she walks.  ?

## 2021-12-19 ENCOUNTER — Emergency Department (HOSPITAL_COMMUNITY)
Admission: EM | Admit: 2021-12-19 | Discharge: 2021-12-19 | Disposition: A | Discharge status: Home / Self Care | Payer: Medicaid Other | Attending: Emergency Medicine | Admitting: Emergency Medicine

## 2021-12-19 DIAGNOSIS — B070 Plantar wart: Secondary | ICD-10-CM

## 2021-12-19 NOTE — ED Provider Notes (Signed)
?  [facility] ?Provider Note ? ? ?CSN: [id number] ?Arrival date & time: 12/18/21  2222 ? ?  ? ?History ? ?Chief Complaint  ?Patient presents with  ? Foot Pain  ? ? ?Denisa Enterline is a 26 y.o. female. ? ?Patient is a 26 year old female with history of hyperthyroidism presenting with complaints of pain to her right heel.  Patient describes walking her dog 2 weeks ago when she got caught up in the dog's leash.  This wrapped around her ankle causing her to fall.  Since then she has felt pain to the bottom of her foot.  She has a small white area that feels like glass when she steps down. ? ?The history is provided by the patient.  ? ?  ? ?Home Medications ?Prior to Admission medications   ?Medication Sig Start Date End Date Taking? Authorizing Provider  ?amoxicillin (AMOXIL) 500 MG capsule Take 1 capsule (500 mg total) by mouth 3 (three) times daily. 06/05/21   Elson Areas, PA-C  ?predniSONE (DELTASONE) 10 MG tablet Take 6 tablets day one, 5 tablets day two, 4 tablets day three, 3 tablets day four, 2 tablets day five, then 1 tablet day six 02/15/20   Burgess Amor, PA-C  ?   ? ?Allergies    ?Azithromycin and Hydrocodone   ? ?Review of Systems   ?Review of Systems  ?All other systems reviewed and are negative. ? ?Physical Exam ?Updated Vital Signs ?BP 114/83 (BP Location: Right Arm)   Pulse 69   Temp 98.1 ?F (36.7 ?C) (Oral)   Resp 16   LMP 12/17/2021   SpO2 100%  ?Physical Exam ?Vitals and nursing note reviewed.  ?Constitutional:   ?   General: She is not in acute distress. ?   Appearance: Normal appearance. She is not ill-appearing.  ?HENT:  ?   Head: Normocephalic and atraumatic.  ?Pulmonary:  ?   Effort: Pulmonary effort is normal.  ?Skin: ?   General: Skin is warm and dry.  ?   Comments: To the bottom of the right foot, there is a small, 3 to 4 mm, round fleshy lesion.  ?Neurological:  ?   Mental Status: She is alert.  ? ? ?ED Results / Procedures / Treatments   ?Labs ?(all labs ordered are  listed, but only abnormal results are displayed) ?Labs Reviewed - No data to display ? ?EKG ?None ? ?Radiology ?DG Foot Complete Right ? ?Result Date: 12/18/2021 ?CLINICAL DATA:  Right foot pain.  Dropped heavy object on foot. EXAM: RIGHT FOOT COMPLETE - 3+ VIEW COMPARISON:  None Available. FINDINGS: There is no evidence of fracture or dislocation. There is no evidence of arthropathy or other focal bone abnormality. Soft tissues are unremarkable. IMPRESSION: Negative. Electronically Signed   By: Charlett Nose M.D.   On: 12/18/2021 23:02   ? ?Procedures ?Procedures  ? ? ?Medications Ordered in ED ?Medications - No data to display ? ?ED Course/ Medical Decision Making/ A&P ? ?Patient presenting with foot pain and a small lesion that I suspect is a plantars wart.  Patient advised to use Compound W and duct tape and follow-up as needed. ? ?X-rays are negative for foreign body. ? ?Final Clinical Impression(s) / ED Diagnoses ?Final diagnoses:  ?None  ? ? ?Rx / DC Orders ?ED Discharge Orders   ? ? None  ? ?  ? ? ?  ?Geoffery Lyons, MD ?12/19/21 0425 ? ?

## 2021-12-19 NOTE — Discharge Instructions (Signed)
Use Compound W on a small piece of cotton and cover with duct tape. ? ?Follow-up with primary doctor if not improving in the next 1 to 2 weeks. ?

## 2022-01-05 DIAGNOSIS — S5011XA Contusion of right forearm, initial encounter: Secondary | ICD-10-CM | POA: Insufficient documentation

## 2022-01-05 DIAGNOSIS — S59911A Unspecified injury of right forearm, initial encounter: Secondary | ICD-10-CM | POA: Diagnosis present

## 2022-01-05 DIAGNOSIS — W540XXA Bitten by dog, initial encounter: Secondary | ICD-10-CM | POA: Diagnosis not present

## 2022-01-06 ENCOUNTER — Emergency Department (HOSPITAL_COMMUNITY): Payer: Medicaid Other

## 2022-01-06 ENCOUNTER — Other Ambulatory Visit: Payer: Self-pay

## 2022-01-06 ENCOUNTER — Emergency Department (HOSPITAL_COMMUNITY)
Admission: EM | Admit: 2022-01-06 | Discharge: 2022-01-06 | Disposition: A | Discharge status: Home / Self Care | Payer: Medicaid Other | Attending: Emergency Medicine | Admitting: Emergency Medicine

## 2022-01-06 ENCOUNTER — Encounter (HOSPITAL_COMMUNITY): Payer: Self-pay

## 2022-01-06 DIAGNOSIS — S5011XA Contusion of right forearm, initial encounter: Secondary | ICD-10-CM

## 2022-01-06 DIAGNOSIS — W540XXA Bitten by dog, initial encounter: Secondary | ICD-10-CM

## 2022-01-06 MED ORDER — IBUPROFEN 800 MG PO TABS
800.0000 mg | ORAL_TABLET | Freq: Once | ORAL | Status: AC
  Administered 2022-01-06: 800 mg via ORAL
  Filled 2022-01-06: qty 1

## 2022-01-06 NOTE — ED Triage Notes (Signed)
Pt states her dog bit her right arm, no active bleeding at this time. Pt states dog is up to date on rabies vaccination.

## 2022-01-06 NOTE — ED Provider Notes (Addendum)
  [facility] EMERGENCY DEPARTMENT Provider Note   CSN: [id number] Arrival date & time: 01/05/22  2330     History  Chief Complaint  Patient presents with   Animal Bite    Katelyn Reed is a 26 y.o. female.  Presents to the emergency department for evaluation of dog bite to right forearm.  Patient's dog got startled and bit her on the forearm tonight.  Patient's dog is up-to-date with rabies vaccination.      Home Medications Prior to Admission medications   Medication Sig Start Date End Date Taking? Authorizing Provider  amoxicillin (AMOXIL) 500 MG capsule Take 1 capsule (500 mg total) by mouth 3 (three) times daily. 06/05/21   Elson Areas, PA-C  predniSONE (DELTASONE) 10 MG tablet Take 6 tablets day one, 5 tablets day two, 4 tablets day three, 3 tablets day four, 2 tablets day five, then 1 tablet day six 02/15/20   Idol, Raynelle Fanning, PA-C      Allergies    Azithromycin and Hydrocodone    Review of Systems   Review of Systems  Physical Exam Updated Vital Signs BP 114/72   Pulse 92   Temp 98.5 F (36.9 C)   Resp 18   Ht 4\' 7"  (1.397 m)   Wt 59 kg   LMP 12/17/2021   SpO2 99%   BMI 30.21 kg/m  Physical Exam Vitals and nursing note reviewed.  Constitutional:      Appearance: Normal appearance.  HENT:     Head: Normocephalic and atraumatic.  Musculoskeletal:     Right forearm: Tenderness present. No deformity.  Skin:    Comments: Multiple abrasions dorsal and volar aspect of mid right forearm with associated soft tissue bruising, no significant laceration or deep soft tissue injury  Neurological:     Mental Status: She is alert.    ED Results / Procedures / Treatments   Labs (all labs ordered are listed, but only abnormal results are displayed) Labs Reviewed - No data to display  EKG None  Radiology No results found.  Procedures Procedures    Medications Ordered in ED Medications - No data to display  ED Course/ Medical Decision Making/ A&P                            Medical Decision Making Amount and/or Complexity of Data Reviewed Radiology: ordered and independent interpretation performed. Decision-making details documented in ED Course.   Patient presents for dog bite.  Patient has very superficial abrasions to the skin with some associated bruising.  As the wounds are so superficial, does not require antibiotic coverage.  X-ray does not show fracture.        Final Clinical Impression(s) / ED Diagnoses Final diagnoses:  Dog bite, initial encounter  Contusion of right forearm, initial encounter    Rx / DC Orders ED Discharge Orders     None         Cheryle Dark, 02/16/2022, MD 01/06/22 0024    01/08/22, MD 01/06/22 0130

## 2022-09-01 ENCOUNTER — Emergency Department (HOSPITAL_COMMUNITY): Payer: Medicaid Other

## 2022-09-01 ENCOUNTER — Encounter (HOSPITAL_COMMUNITY): Payer: Self-pay

## 2022-09-01 ENCOUNTER — Other Ambulatory Visit: Payer: Self-pay

## 2022-09-01 DIAGNOSIS — Y93K1 Activity, walking an animal: Secondary | ICD-10-CM | POA: Diagnosis not present

## 2022-09-01 DIAGNOSIS — S61212A Laceration without foreign body of right middle finger without damage to nail, initial encounter: Secondary | ICD-10-CM | POA: Diagnosis not present

## 2022-09-01 DIAGNOSIS — R030 Elevated blood-pressure reading, without diagnosis of hypertension: Secondary | ICD-10-CM | POA: Insufficient documentation

## 2022-09-01 DIAGNOSIS — W268XXA Contact with other sharp object(s), not elsewhere classified, initial encounter: Secondary | ICD-10-CM | POA: Insufficient documentation

## 2022-09-01 DIAGNOSIS — S6991XA Unspecified injury of right wrist, hand and finger(s), initial encounter: Secondary | ICD-10-CM | POA: Diagnosis present

## 2022-09-01 NOTE — ED Triage Notes (Signed)
Pt reports her dog pulled the dog tie out cable out of her hand and it wrapped around her right middle finger resulting in a laceration.

## 2022-09-02 ENCOUNTER — Emergency Department (HOSPITAL_COMMUNITY)
Admission: EM | Admit: 2022-09-02 | Discharge: 2022-09-02 | Disposition: A | Discharge status: Home / Self Care | Payer: Medicaid Other | Attending: Emergency Medicine | Admitting: Emergency Medicine

## 2022-09-02 DIAGNOSIS — S61212A Laceration without foreign body of right middle finger without damage to nail, initial encounter: Secondary | ICD-10-CM

## 2022-09-02 DIAGNOSIS — R030 Elevated blood-pressure reading, without diagnosis of hypertension: Secondary | ICD-10-CM

## 2022-09-02 MED ORDER — BACITRACIN ZINC 500 UNIT/GM EX OINT
TOPICAL_OINTMENT | CUTANEOUS | Status: AC
  Filled 2022-09-02: qty 0.9

## 2022-09-02 MED ORDER — LIDOCAINE HCL (PF) 2 % IJ SOLN
10.0000 mL | Freq: Once | INTRAMUSCULAR | Status: AC
  Administered 2022-09-02: 10 mL

## 2022-09-02 NOTE — Discharge Instructions (Addendum)
Your blood pressure was a little high today.  That is probably from the stress of coming to the hospital emergency department.  However, I recommend that you have your blood pressure checked several times in the next 1-2 weeks.  If your blood pressure continues to run high, you may need to be on medication to control it.  If your blood pressure is normal when you have it rechecked, you do not have to worry about it.

## 2022-09-02 NOTE — ED Provider Notes (Signed)
[facility] Provider Note   CSN: [id number] Arrival date & time: 09/01/22  2228     History  Chief Complaint  Patient presents with   Finger Injury    Katelyn Reed is a 27 y.o. female.  The history is provided by the patient.  She has history of anxiety, depression, ADHD, PTSD, achondroplastic dwarfism and comes in after suffering an injury to her right middle finger.  She was walking her dog when the rope wrapped around her finger and the dog started to run causing lacerations both to the dorsal and volar aspects of the finger.  She is up-to-date on tetanus immunizations.   Home Medications Prior to Admission medications   Medication Sig Start Date End Date Taking? Authorizing Provider  amoxicillin (AMOXIL) 500 MG capsule Take 1 capsule (500 mg total) by mouth 3 (three) times daily. 06/05/21   Fransico Meadow, PA-C  predniSONE (DELTASONE) 10 MG tablet Take 6 tablets day one, 5 tablets day two, 4 tablets day three, 3 tablets day four, 2 tablets day five, then 1 tablet day six 02/15/20   Idol, Almyra Free, PA-C      Allergies    Azithromycin, Kiwi extract, and Hydrocodone    Review of Systems   Review of Systems  All other systems reviewed and are negative.   Physical Exam Updated Vital Signs BP (!) 147/81 (BP Location: Left Arm)   Pulse 90   Temp 98.7 F (37.1 C) (Oral)   Resp 16   Ht 4\' 6"  (1.372 m)   Wt 83.9 kg   LMP 08/18/2022 (Approximate)   SpO2 99%   BMI 44.61 kg/m  Physical Exam Vitals and nursing note reviewed.   27 year old female, resting comfortably and in no acute distress. Vital signs are significant for elevated blood pressure. Oxygen saturation is 99%, which is normal. Head is normocephalic and atraumatic. PERRLA, EOMI. Lungs are clear without rales, wheezes, or rhonchi. Chest is nontender. Heart has regular rate and rhythm without murmur. Abdomen is soft, flat, nontender. Extremities: Lacerations present  are present on the dorsum and volar surface of the right third finger middle phalanx.  Tendon function is normal, sensory exam is normal. Skin is warm and dry without rash. Neurologic: Mental status is normal, cranial nerves are intact, there are no motor or sensory deficits.      ED Results / Procedures / Treatments    Radiology DG Finger Middle Right  Result Date: 09/01/2022 CLINICAL DATA:  Trauma EXAM: RIGHT MIDDLE FINGER 2+V COMPARISON:  None Available. FINDINGS: There is no evidence of fracture or dislocation. There is no evidence of arthropathy or other focal bone abnormality. No radiopaque foreign body in the soft tissues. IMPRESSION: Negative. Electronically Signed   By: Donavan Foil M.D.   On: 09/01/2022 23:26    Procedures .Nerve Block  Date/Time: 09/02/2022 4:18 AM  Performed by: Delora Fuel, MD Authorized by: Delora Fuel, MD   Consent:    Consent obtained:  Verbal   Consent given by:  Patient   Risks, benefits, and alternatives were discussed: yes     Risks discussed:  Allergic reaction, infection, nerve damage and unsuccessful block   Alternatives discussed:  Alternative treatment Universal protocol:    Procedure explained and questions answered to patient or proxy's satisfaction: yes     Relevant documents present and verified: yes     Test results available: yes     Imaging studies available: yes  Required blood products, implants, devices, and special equipment available: yes     Site/side marked: yes     Immediately prior to procedure, a time out was called: yes     Patient identity confirmed:  Verbally with patient and arm band Indications:    Indications:  Procedural anesthesia Location:    Body area:  Upper extremity   Upper extremity nerve:  Metacarpal   Laterality:  Right Pre-procedure details:    Skin preparation:  Alcohol   Preparation: Patient was prepped and draped in usual sterile fashion   Skin anesthesia:    Skin anesthesia method:   None Procedure details:    Block needle gauge:  25 G   Anesthetic injected:  Lidocaine 2% w/o epi   Steroid injected:  None   Additive injected:  None   Injection procedure:  Anatomic landmarks identified, incremental injection, negative aspiration for blood, introduced needle and anatomic landmarks palpated   Paresthesia:  None Post-procedure details:    Dressing:  None   Outcome:  Anesthesia achieved   Procedure completion:  Tolerated well, no immediate complications .Marland KitchenLaceration Repair  Date/Time: 09/02/2022 4:19 AM  Performed by: Dione Booze, MD Authorized by: Dione Booze, MD   Consent:    Consent obtained:  Verbal   Consent given by:  Patient   Risks, benefits, and alternatives were discussed: yes     Risks discussed:  Infection, pain and poor wound healing   Alternatives discussed:  No treatment Universal protocol:    Procedure explained and questions answered to patient or proxy's satisfaction: yes     Relevant documents present and verified: yes     Test results available: yes     Imaging studies available: yes     Required blood products, implants, devices, and special equipment available: yes     Site/side marked: yes     Immediately prior to procedure, a time out was called: yes     Patient identity confirmed:  Verbally with patient and arm band Anesthesia:    Anesthesia method:  Nerve block   Block location:  Right hand   Block needle gauge:  25 G   Block anesthetic:  Lidocaine 2% w/o epi   Block technique:  Digital block   Block injection procedure:  Anatomic landmarks identified, anatomic landmarks palpated, negative aspiration for blood, introduced needle and incremental injection   Block outcome:  Anesthesia achieved Laceration details:    Location:  Finger   Finger location:  R long finger   Length (cm):  3   Depth (mm):  2 Pre-procedure details:    Preparation:  Patient was prepped and draped in usual sterile fashion and imaging obtained to evaluate  for foreign bodies Exploration:    Limited defect created (wound extended): no     Hemostasis achieved with:  Direct pressure   Imaging obtained: x-ray     Imaging outcome: foreign body not noted     Wound exploration: wound explored through full range of motion and entire depth of wound visualized     Wound extent: no foreign body, no nerve damage and no tendon damage     Contaminated: no   Treatment:    Area cleansed with:  Saline   Amount of cleaning:  Standard   Debridement:  None   Undermining:  None   Scar revision: no   Skin repair:    Repair method:  Sutures   Suture size:  5-0   Suture material:  Nylon   Suture  technique:  Simple interrupted   Number of sutures:  6 Approximation:    Approximation:  Close Repair type:    Repair type:  Simple Post-procedure details:    Dressing:  Antibiotic ointment and adhesive bandage   Procedure completion:  Tolerated well, no immediate complications .Marland KitchenLaceration Repair  Date/Time: 09/02/2022 4:21 AM  Performed by: Dione Booze, MD Authorized by: Dione Booze, MD   Consent:    Consent obtained:  Verbal   Consent given by:  Patient   Risks, benefits, and alternatives were discussed: yes     Risks discussed:  Infection, pain and poor wound healing   Alternatives discussed:  No treatment Universal protocol:    Procedure explained and questions answered to patient or proxy's satisfaction: yes     Relevant documents present and verified: yes     Test results available: yes     Imaging studies available: yes     Required blood products, implants, devices, and special equipment available: yes     Site/side marked: yes     Immediately prior to procedure, a time out was called: yes     Patient identity confirmed:  Verbally with patient and arm band Anesthesia:    Anesthesia method:  Nerve block   Block location:  Right hand   Block needle gauge:  25 G   Block anesthetic:  Lidocaine 2% w/o epi   Block technique:  Digital block    Block injection procedure:  Anatomic landmarks identified, introduced needle, incremental injection, negative aspiration for blood and anatomic landmarks palpated   Block outcome:  Anesthesia achieved Laceration details:    Location:  Finger   Finger location:  R long finger   Length (cm):  1.5   Depth (mm):  3 Pre-procedure details:    Preparation:  Patient was prepped and draped in usual sterile fashion and imaging obtained to evaluate for foreign bodies Exploration:    Limited defect created (wound extended): no     Hemostasis achieved with:  Direct pressure   Imaging obtained: x-ray     Imaging outcome: foreign body not noted     Wound exploration: wound explored through full range of motion and entire depth of wound visualized     Wound extent: no foreign body, no nerve damage and no tendon damage     Contaminated: no   Treatment:    Area cleansed with:  Saline   Amount of cleaning:  Standard   Debridement:  None   Undermining:  None   Scar revision: no   Skin repair:    Repair method:  Sutures   Suture size:  5-0   Suture material:  Nylon   Suture technique:  Simple interrupted   Number of sutures:  3 Approximation:    Approximation:  Close Repair type:    Repair type:  Simple Post-procedure details:    Dressing:  Antibiotic ointment and adhesive bandage   Procedure completion:  Tolerated well, no immediate complications     Medications Ordered in ED Medications  lidocaine HCl (PF) (XYLOCAINE) 2 % injection 10 mL (has no administration in time range)    ED Course/ Medical Decision Making/ A&P                             Medical Decision Making Amount and/or Complexity of Data Reviewed Radiology: ordered.  Risk Prescription drug management.   Laceration of right middle finger without evidence of tendon or nerve  injury.  X-rays show no fracture or foreign body.  Have independently viewed the images, and agree with radiologist interpretation.  I have ordered  lidocaine for digital block and she will need sutures.  Once anesthesia was obtained with digital block, I have placed sutures with good cosmetic closure obtained.  She is discharged with instruction sutures removed in 7-10 days.  Final Clinical Impression(s) / ED Diagnoses Final diagnoses:  Laceration of right middle finger, initial encounter  Elevated blood pressure reading without diagnosis of hypertension    Rx / DC Orders ED Discharge Orders     None         Delora Fuel, MD 62/95/28 [phone]

## 2022-09-02 NOTE — ED Notes (Signed)
Bacitracin applied to figure and finger wrapped with gauze and coband

## 2022-09-09 ENCOUNTER — Encounter: Payer: Self-pay | Admitting: Adult Health

## 2022-09-09 ENCOUNTER — Other Ambulatory Visit (HOSPITAL_COMMUNITY)
Admission: RE | Admit: 2022-09-09 | Discharge: 2022-09-09 | Disposition: A | Discharge status: Home / Self Care | Payer: Medicaid Other | Source: Ambulatory Visit | Attending: Adult Health | Admitting: Adult Health

## 2022-09-09 ENCOUNTER — Ambulatory Visit (INDEPENDENT_AMBULATORY_CARE_PROVIDER_SITE_OTHER): Payer: Medicaid Other | Admitting: Adult Health

## 2022-09-09 VITALS — BP 116/72 | HR 76 | Ht <= 58 in | Wt 181.0 lb

## 2022-09-09 DIAGNOSIS — Z1329 Encounter for screening for other suspected endocrine disorder: Secondary | ICD-10-CM | POA: Diagnosis not present

## 2022-09-09 DIAGNOSIS — Z113 Encounter for screening for infections with a predominantly sexual mode of transmission: Secondary | ICD-10-CM | POA: Insufficient documentation

## 2022-09-09 DIAGNOSIS — F419 Anxiety disorder, unspecified: Secondary | ICD-10-CM

## 2022-09-09 DIAGNOSIS — Z0000 Encounter for general adult medical examination without abnormal findings: Secondary | ICD-10-CM | POA: Insufficient documentation

## 2022-09-09 DIAGNOSIS — Z01419 Encounter for gynecological examination (general) (routine) without abnormal findings: Secondary | ICD-10-CM

## 2022-09-09 DIAGNOSIS — R55 Syncope and collapse: Secondary | ICD-10-CM | POA: Diagnosis not present

## 2022-09-09 DIAGNOSIS — Q774 Achondroplasia: Secondary | ICD-10-CM

## 2022-09-09 MED ORDER — BUSPIRONE HCL 5 MG PO TABS: 5.0000 mg | ORAL_TABLET | Freq: Two times a day (BID) | ORAL | 3 refills | Status: DC

## 2022-09-09 NOTE — Progress Notes (Signed)
Patient ID: Katelyn Reed, female   DOB: Apr 20, 1996, 27 y.o.   MRN: [id number] History of Present Illness:  Katelyn Reed is a 27 year old white female, with SO, G0P0, in for a well woman gyn exam and first pap. She is a new pt. She was adopted. 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    Current Medications, Allergies, Past Medical History, Past Surgical History, Family History and Social History were reviewed in [employer] record.     Review of Systems: Patient denies any headaches, hearing loss, fatigue, blurred vision, shortness of breath, chest pain, abdominal pain, problems with bowel movements, urination, or intercourse. No joint pain or mood swings.  She said she almost fainted in November. Periods are regular and can be heavy at times then light, has cramps at times and occasional clots.    Physical Exam:BP 116/72 (BP Location: Left Arm, Patient Position: Sitting, Cuff Size: Normal)   Pulse 76   Ht 4\' 7"  (1.397 m)   Wt 181 lb (82.1 kg)   LMP 08/18/2022 (Approximate)   BMI 42.07 kg/m   General:  Well developed, well nourished, no acute distress Skin:  Warm and dry Neck:  Midline trachea, normal thyroid, good ROM, no lymphadenopathy Lungs; Clear to auscultation bilaterally Breast:  No dominant palpable mass, retraction, or nipple discharge Cardiovascular: Regular rate and rhythm Abdomen:  Soft, non tender, no hepatosplenomegaly Pelvic:  External genitalia is normal in appearance, no lesions.  The vagina is normal in appearance. Urethra has no lesions or masses. The cervix is smooth,pap with GC/CHL and reflex HPV performed.  Uterus is felt to be normal size, shape, and contour.  No adnexal masses or tenderness noted.Bladder is non tender, no masses felt. Extremities/musculoskeletal:  No swelling or varicosities noted, no clubbing or cyanosis. She has splint right third finger has stitches, dog run cut her. Psych:  No mood changes,  alert and cooperative,seems happy AA is 2 Fall risk is low    09/09/2022   10:24 AM  Depression screen PHQ 2/9  Decreased Interest 0  Down, Depressed, Hopeless 0  PHQ - 2 Score 0  Altered sleeping 2  Tired, decreased energy 0  Change in appetite 1  Feeling bad or failure about yourself  0  Trouble concentrating 0  Moving slowly or fidgety/restless 0  Suicidal thoughts 0  PHQ-9 Score 3       09/09/2022   10:26 AM  GAD 7 : Generalized Anxiety Score  Nervous, Anxious, on Edge 2  Control/stop worrying 1  Worry too much - different things 1  Trouble relaxing 1  Restless 1  Easily annoyed or irritable 1  Afraid - awful might happen 1  Total GAD 7 Score 8      Upstream - 09/09/22 0956       Pregnancy Intention Screening   Does the patient want to become pregnant in the next year? No    Does the patient's partner want to become pregnant in the next year? No    Would the patient like to discuss contraceptive options today? No      Contraception Wrap Up   Current Method Female Condom    End Method Female Condom            Examination chaperoned by Levy Pupa LPN   Impression and Plan: 1. Routine general medical examination at a health care facility Pap sent Pap in 3 years if normal Physical in 1 year Will  check labs - Cytology - PAP( [facility]) - CBC - Comprehensive metabolic panel Told her several people to see about for PCP  2. Encounter for gynecological examination with Papanicolaou smear of cervix Pap sent   3. Screening examination for STD (sexually transmitted disease) - Hepatitis C antibody - HIV Antibody (routine testing w rflx) - RPR - Hepatitis B surface antigen  4. Syncope, unspecified syncope type  5. Screening for thyroid disorder - TSH + free T4 Will talk when labs back  6. Anxiety Will rx Buspar Meds ordered this encounter  Medications   busPIRone (BUSPAR) 5 MG tablet    Sig: Take 1 tablet (5 mg total) by mouth 2 (two) times daily.     Dispense:  60 tablet    Refill:  3    Order Specific Question:   Supervising Provider    Answer:   Tania Ade H [[id number]]    Follow up in 3 months for ROS  7. Hypochondroplasia

## 2022-09-10 ENCOUNTER — Telehealth: Payer: Self-pay | Admitting: Adult Health

## 2022-09-10 DIAGNOSIS — E039 Hypothyroidism, unspecified: Secondary | ICD-10-CM

## 2022-09-10 LAB — COMPREHENSIVE METABOLIC PANEL
ALT: 9 IU/L (ref 0–32)
AST: 15 IU/L (ref 0–40)
Albumin/Globulin Ratio: 1.5 (ref 1.2–2.2)
Albumin: 4.3 g/dL (ref 4.0–5.0)
Alkaline Phosphatase: 74 IU/L (ref 44–121)
BUN/Creatinine Ratio: 20 (ref 9–23)
BUN: 14 mg/dL (ref 6–20)
Bilirubin Total: 0.3 mg/dL (ref 0.0–1.2)
CO2: 20 mmol/L (ref 20–29)
Calcium: 9.5 mg/dL (ref 8.7–10.2)
Chloride: 105 mmol/L (ref 96–106)
Creatinine, Ser: 0.71 mg/dL (ref 0.57–1.00)
Globulin, Total: 2.8 g/dL (ref 1.5–4.5)
Glucose: 91 mg/dL (ref 70–99)
Potassium: 4.5 mmol/L (ref 3.5–5.2)
Sodium: 140 mmol/L (ref 134–144)
Total Protein: 7.1 g/dL (ref 6.0–8.5)
eGFR: 122 mL/min/{1.73_m2} (ref >59)

## 2022-09-10 LAB — RPR: RPR Ser Ql: NONREACTIVE

## 2022-09-10 LAB — CBC
Hematocrit: 43.9 % (ref 34.0–46.6)
Hemoglobin: 14.4 g/dL (ref 11.1–15.9)
MCH: 27.5 pg (ref 26.6–33.0)
MCHC: 32.8 g/dL (ref 31.5–35.7)
MCV: 84 fL (ref 79–97)
Platelets: 343 10*3/uL (ref 150–450)
RBC: 5.23 x10E6/uL (ref 3.77–5.28)
RDW: 13 % (ref 11.7–15.4)
WBC: 11.1 10*3/uL — ABNORMAL HIGH (ref 3.4–10.8)

## 2022-09-10 LAB — HEPATITIS B SURFACE ANTIGEN: Hepatitis B Surface Ag: NEGATIVE

## 2022-09-10 LAB — TSH+FREE T4
Free T4: 0.72 ng/dL — ABNORMAL LOW (ref 0.82–1.77)
TSH: 13.7 u[IU]/mL — ABNORMAL HIGH (ref 0.450–4.500)

## 2022-09-10 LAB — HIV ANTIBODY (ROUTINE TESTING W REFLEX): HIV Screen 4th Generation wRfx: NONREACTIVE

## 2022-09-10 LAB — HEPATITIS C ANTIBODY: Hep C Virus Ab: NONREACTIVE

## 2022-09-10 MED ORDER — LEVOTHYROXINE SODIUM 50 MCG PO TABS: 50.0000 ug | ORAL_TABLET | Freq: Every day | ORAL | 3 refills | Status: DC

## 2022-09-10 NOTE — Telephone Encounter (Signed)
Pt aware of labs, and that TSH is elevated at 13.700 and Free T4 low at 0.72 will rx synthroid 50 mcg 1 daily and recheck labs in 3 months

## 2022-09-11 LAB — CYTOLOGY - PAP
Chlamydia: NEGATIVE
Comment: NEGATIVE
Comment: NEGATIVE
Comment: NORMAL
Diagnosis: NEGATIVE
High risk HPV: NEGATIVE
Neisseria Gonorrhea: NEGATIVE

## 2023-01-04 ENCOUNTER — Other Ambulatory Visit: Payer: Self-pay | Admitting: Adult Health

## 2023-08-25 IMAGING — DX DG FOOT COMPLETE 3+V*R*
3 series · 3 of 3 positions shown · non-contrast
Comparison: None Available.

CLINICAL DATA: Right foot pain.  Dropped heavy object on foot.

EXAM:
RIGHT FOOT COMPLETE - 3+ VIEW

[foot ap]
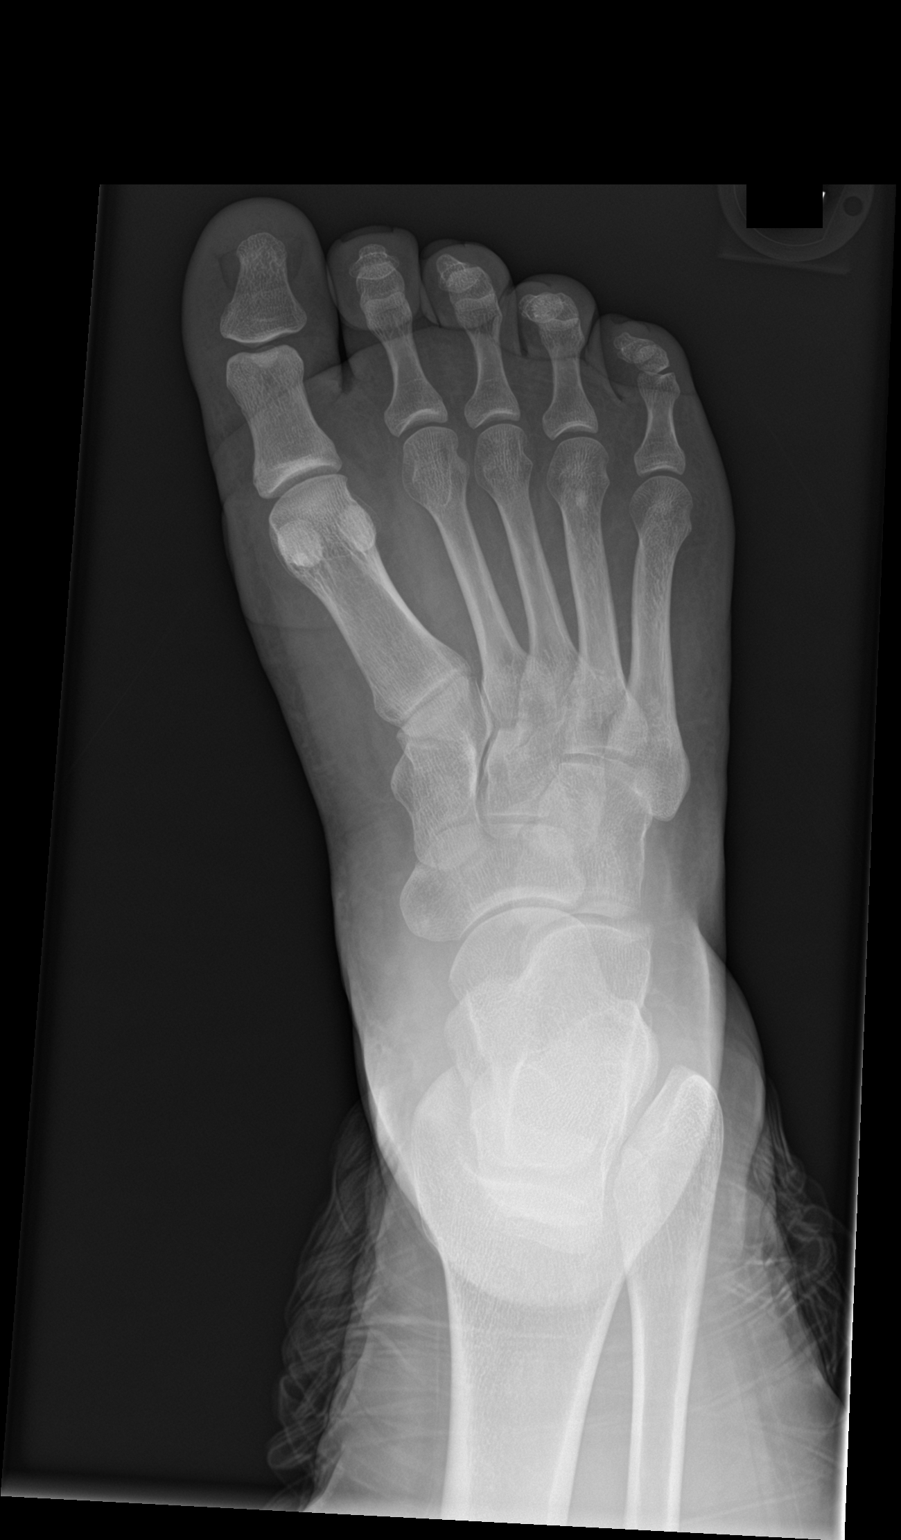

[foot obl]
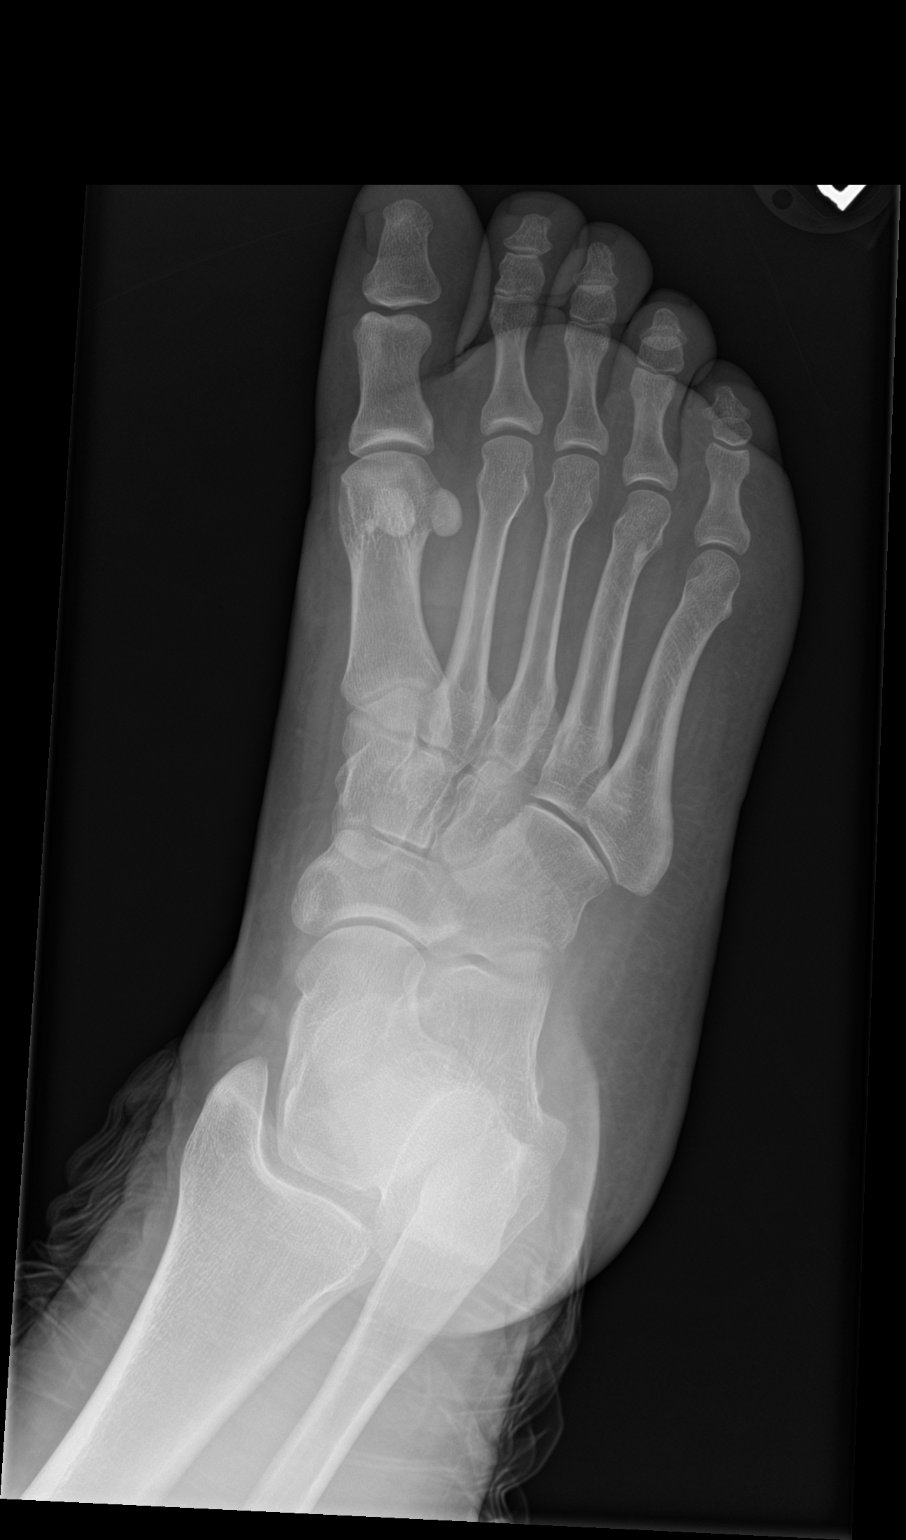

[foot lat]
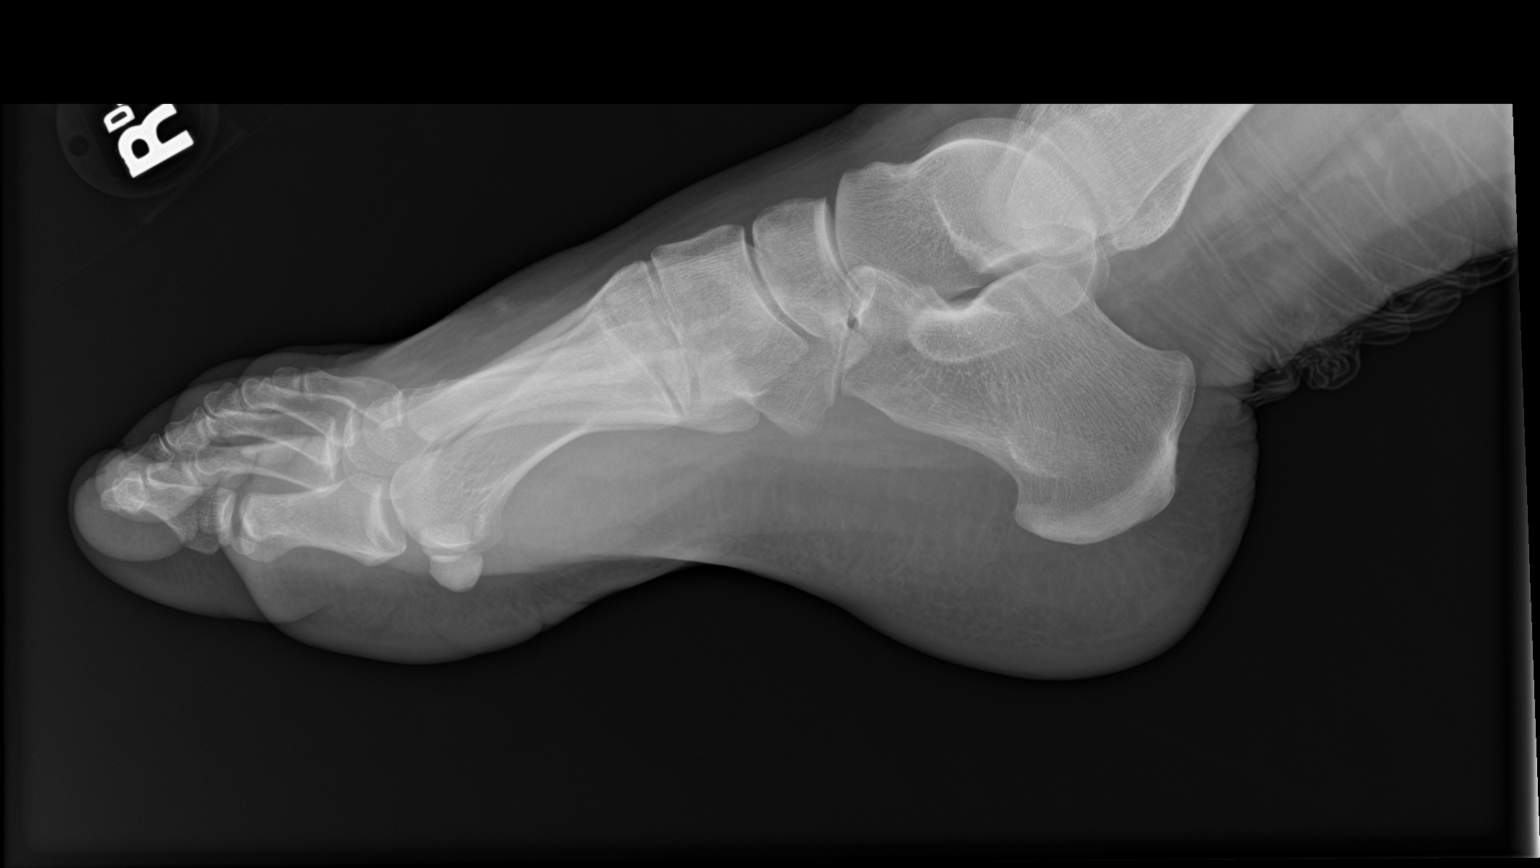

[3 of 3 positions shown; findings below may reference images not displayed]

FINDINGS: There is no evidence of fracture or dislocation. There is no
evidence of arthropathy or other focal bone abnormality. Soft
tissues are unremarkable.
IMPRESSION: Negative.

## 2023-09-13 IMAGING — DX DG FOREARM 2V*R*
2 series · 2 of 2 positions shown · non-contrast
Comparison: None Available.

CLINICAL DATA: Dog bite to forearm

EXAM:
RIGHT FOREARM - 2 VIEW

[forearm ap]
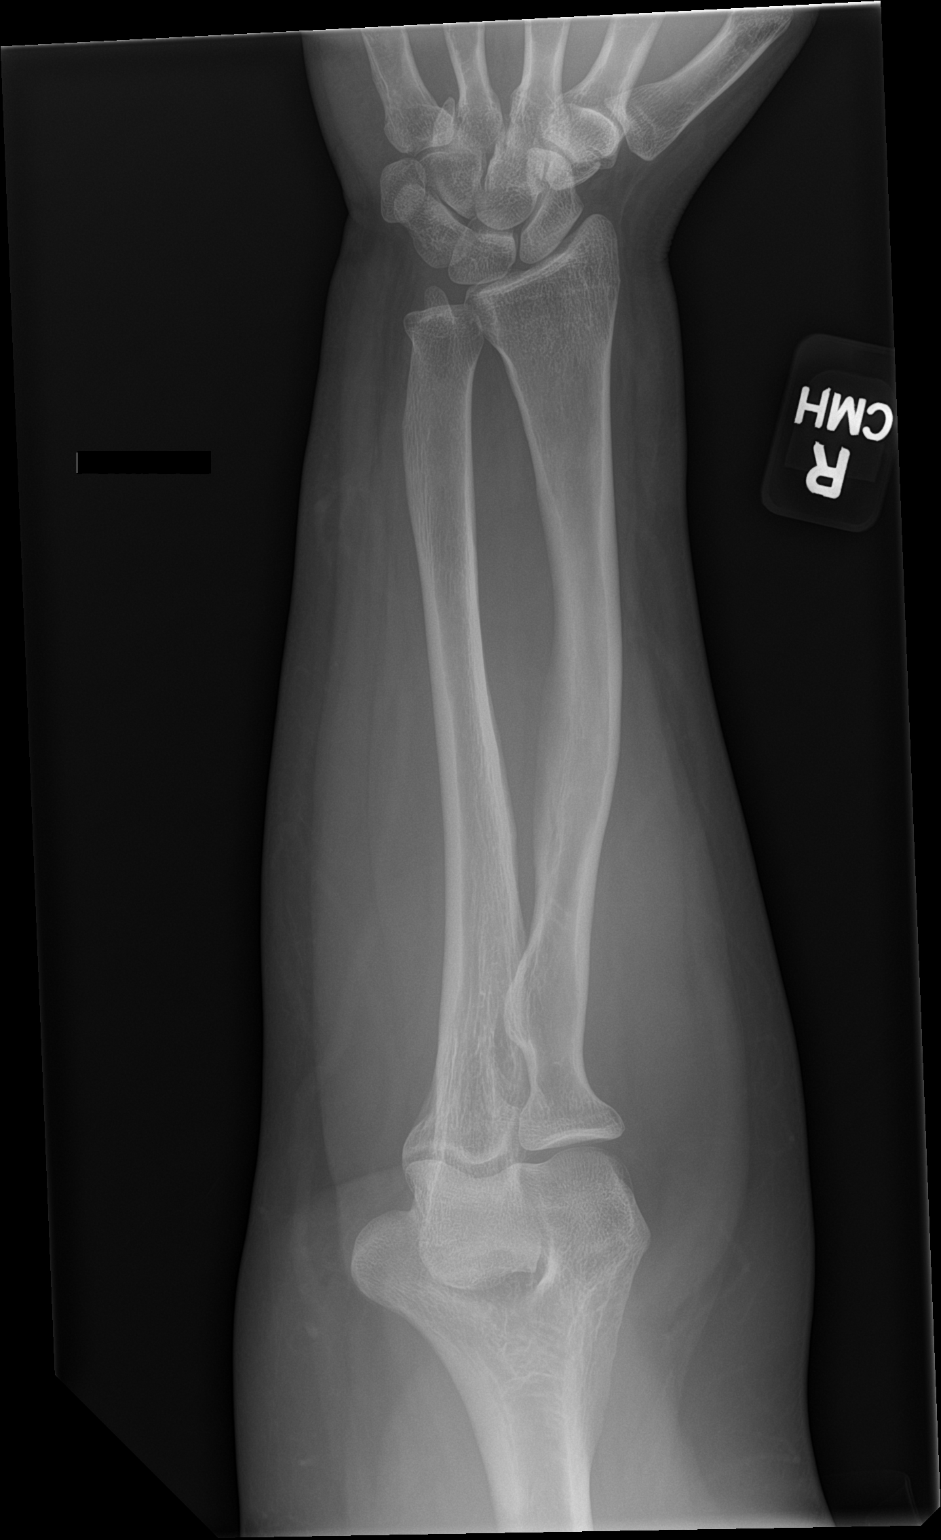

[forearm lat]
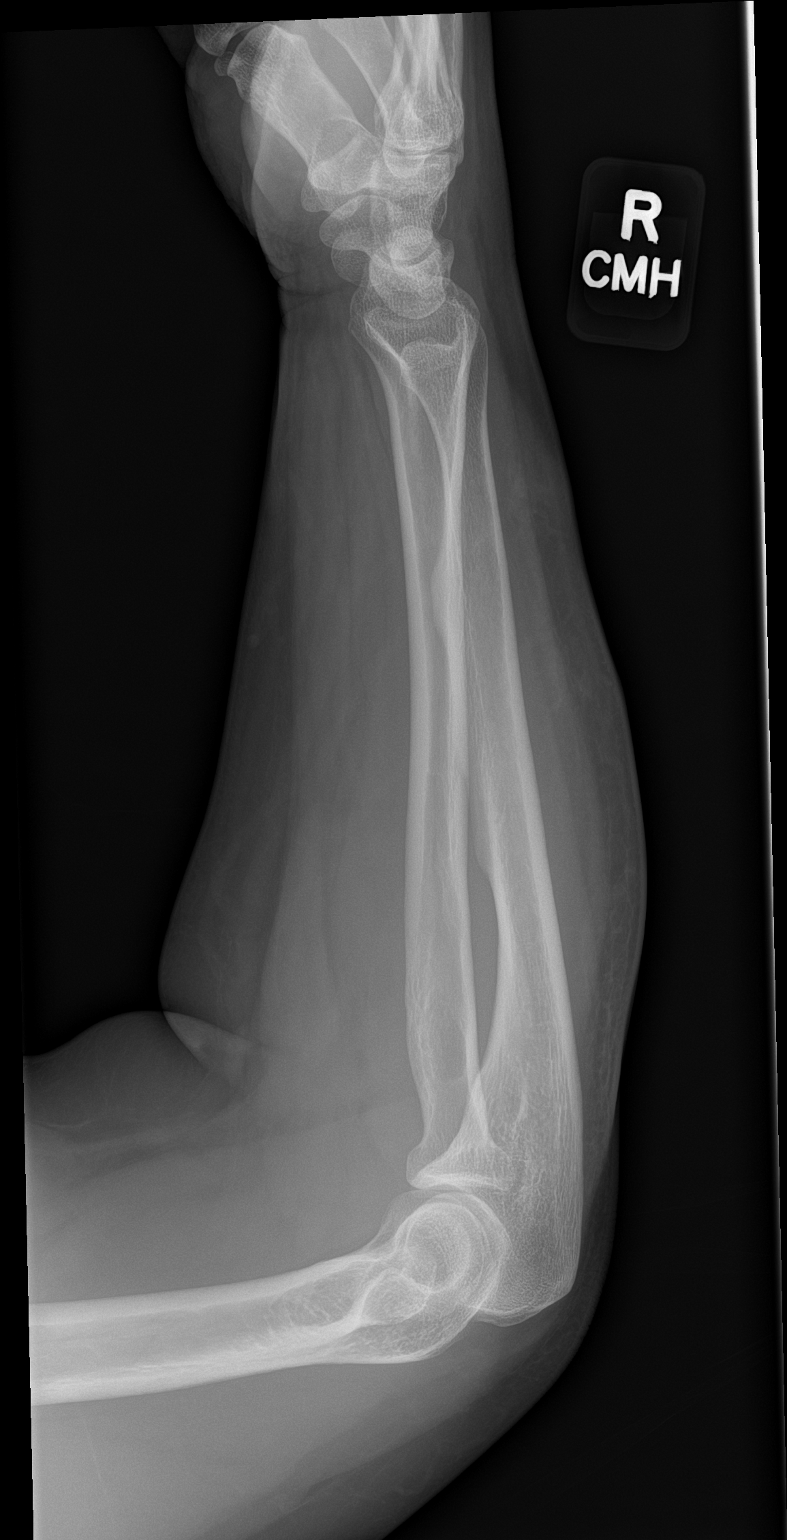

[2 of 2 positions shown; findings below may reference images not displayed]

FINDINGS: There is no evidence of fracture or other focal bone lesions. Soft
tissues are unremarkable. No radiopaque foreign body or soft tissue
gas.
IMPRESSION: Negative.
